# Patient Record
Sex: Female | Born: 1965 | Race: White | Hispanic: No
Health system: Midwestern US, Community
[De-identification: ages and names within clinical notes are randomized; demographics above are authoritative.]

## PROBLEM LIST (undated history)

## (undated) DIAGNOSIS — Z0000 Encounter for general adult medical examination without abnormal findings: Principal | ICD-10-CM

## (undated) DIAGNOSIS — I10 Essential (primary) hypertension: Secondary | ICD-10-CM

## (undated) DIAGNOSIS — E349 Endocrine disorder, unspecified: Secondary | ICD-10-CM

## (undated) DIAGNOSIS — Z4001 Encounter for prophylactic removal of breast: Secondary | ICD-10-CM

## (undated) DIAGNOSIS — N281 Cyst of kidney, acquired: Secondary | ICD-10-CM

## (undated) HISTORY — DX: Encounter for prophylactic removal of breast: Z40.01

## (undated) HISTORY — PX: CHOLECYSTECTOMY: SHX55

## (undated) HISTORY — PX: KNEE SURGERY: SHX244

## (undated) HISTORY — DX: Essential (primary) hypertension: I10

## (undated) HISTORY — DX: Cyst of kidney, acquired: N28.1

## (undated) HISTORY — DX: Endocrine disorder, unspecified: E34.9

---

## 1999-03-14 HISTORY — PX: TOTAL ABDOMINAL HYSTERECTOMY: SHX209

## 2015-01-28 NOTE — Procedures (Signed)
IntraOp Record - [facility]             IntraOp Record - VQQV Summary                                                                   Primary Physician:        Josiah Lobo A.-MD    Case Number:              [id number]    Finalized Date/Time:      01/28/15 13:11:06    Pt. Name:                 [name] [name], [name] [name]    D.O.B./Sex:               February 28, 1966    Female    Med Rec #:                [id number]    Physician:                Josiah Lobo A.-MD    Financial #:              [id number]    Pt. Type:                 S    Room/Bed:                 /    Admit/Disch:              01/28/15 11:41:00 -    Institution:       [facility] - Case Times                                                                                                         Entry 1                                                                                                          Patient      In Room Time             01/28/15 12:23:00               Out Room Time                   01/28/15 13:07:00    Anesthesia     Procedure  Start Time               01/28/15 12:33:00               Stop Time                       01/28/15 13:03:00    Last Modified By:         Leatha Gilding RN, DOROTHY                              01/28/15 13:09:25      [facility] - Case Times Audit                                                                          01/28/15 13:09:25         Owner: Felipa Evener                               Modifier: LIVIDO                                                        <+> 1         Out Room Time     01/28/15 13:05:31         Owner: LIVIDO                               Modifier: LIVIDO                                                        <+> 1         Stop Time     01/28/15 12:50:59         Owner: Danella Sensing                               Modifier: LIVIDO                                                        <+> 1         Start Time        [facility] - Safety Checklist - Sign In  Entry 1                                                                                                          History/Physical on       Yes                             Procedure Consent               Yes    Chart                                                     on Chart     Site Marked (if           Yes    applicable)     Last Modified By:         Leatha Gilding RN, DOROTHY                              01/28/15 12:35:46      [facility] - Case Attendance                                                                                                    Entry 1                         Entry 2                         Entry 3                                          Case Attendee             Starleen Arms T-MD           Betha Loa,  Earley Brooke A.-MD    Role Performed            Anesthesiologist                Surgical Scrub                  Surgeon Primary    Time In  01/28/15 12:23:00               01/28/15 12:23:00               01/28/15 12:23:00    Time Out                  01/28/15 13:07:00               01/28/15 13:07:00               01/28/15 13:07:00    Procedure                 Wrist Ganglion Cyst             Wrist Ganglion Cyst             Wrist Ganglion Cyst                              Excision                        Excision                        Excision    Last Modified By:         Leatha Gilding RN, Arlana Hove, RN, Arlana Hove, RN, DOROTHY                              01/28/15 13:11:03               01/28/15 13:11:03               01/28/15 13:11:03                                Entry 4                         Entry 5                                                                          Case Attendee             Leatha Gilding, RN, Barkley Boards, RN, Pricilla Riffle    Role Performed            Circulator                      Circulator Relief    Time In                   01/28/15 12:30:00               01/28/15 12:23:00     Time Out                  01/28/15 13:07:00  01/28/15 12:30:00    Procedure                 Wrist Ganglion Cyst             Wrist Ganglion Cyst                              Excision                        Excision    Last Modified By:         Carlton Adam, RN, DOROTHY                              01/28/15 13:11:03               01/28/15 13:11:03      [facility] - Case Attendance Audit                                                                     01/28/15 13:11:03         Owner: LIVIDO                               Modifier: LIVIDO                                                            1     <+> Time Out            1     <*> Procedure                              Wrist Ganglion Cyst Excision            2     <+> Time Out            2     <*> Procedure                              Wrist Ganglion Cyst Excision            3     <+> Time Out            3     <*> Procedure                              Wrist Ganglion Cyst Excision            4     <+> Time Out            4     <*> Procedure  Wrist Ganglion Cyst Excision            5     <*> Procedure                              Wrist Ganglion Cyst Excision     01/28/15 12:50:37         Owner: LIVIDO                               Modifier: LIVIDO                                                            5     <+> Time Out            5     <*> Procedure                              Wrist Ganglion Cyst Excision     01/28/15 12:36:10         Owner: LIVIDO                               Modifier: LIVIDO                                                            1     <*> Case Attendee                          Starleen Arms T-MD            1     <*> Role Performed                         Anesthesiologist            1     <*> Time In                                01/28/15 12:23:00            1     <+> Procedure            2     <*> Case Attendee                          Josiah Lobo A.-MD             2     <*> Role Performed                         Surgeon Primary            2     <*> Time In  01/28/15 12:23:00            2     <+> Procedure            3     <*> Case Attendee                          Linward Foster, RN, Pricilla Riffle            3     <*> Role Performed                         Circulator Relief            3     <*> Time In                                01/28/15 12:23:00            3     <*> Procedure                              Wrist Ganglion Cyst Excision            4     <*> Case Attendee                          NETHERTON,  BETTY A            4     <*> Role Performed                         Surgical Scrub            4     <*> Time In                                01/28/15 12:23:00            4     <*> Procedure                              Wrist Ganglion Cyst Excision            5     <*> Case Attendee                          Leatha Gilding, RN, DOROTHY            5     <*> Role Performed                         Circulator            5     <*> Time In                                01/28/15 12:30:00            5     <*> Procedure  Wrist Ganglion Cyst Excision     01/28/15 12:35:23         Owner: IRJJOA                               Modifier: LIVIDO                                                            1     <*> Case Attendee                          Starleen Arms T-MD            1     <*> Role Performed                         Anesthesiologist            1     <*> Time In                                01/28/15 12:23:00            2     <*> Case Attendee                          Josiah Lobo A.-MD            2     <*> Role Performed                         Surgeon Primary            2     <*> Time In                                01/28/15 12:23:00            3     <*> Case Attendee                          Linward Foster, RN, Pricilla Riffle            3     <*> Role Performed                         Circulator Relief            3     <*> Time In                                 01/28/15 12:23:00            3     <*> Procedure                              Wrist Ganglion Cyst Excision            4     <*>  Case Attendee                          NETHERTON,  BETTY A            4     <*> Role Performed                         Surgical Scrub            4     <*> Time In                                01/28/15 12:23:00            4     <*> Procedure                              Wrist Ganglion Cyst Excision        Entry 5 was deleted.  Higher numbered entries shifted one position to fill the gap.        <-> 5         Case Attendee                          Leatha Gilding, RN, DOROTHY        <-> 5         Role Performed                         Circulator        <-> 5         Time In        <-> 5         Procedure                              Wrist Ganglion Cyst Excision     01/28/15 12:27:37         Owner: NFAOZH                               Modifier: [email or web address]                                                        <+> 1         Time In        <+> 2         Time In        <+> 3         Case Attendee        <+> 3         Role Performed        <+> 3         Time In        <+> 3         Procedure        <+> 4         Case Attendee        <+> 4  Role Performed        <+> 4         Time In        <+> 4         Procedure        <+> 5         Case Attendee        <+> 5         Role Performed        <+> 5         Procedure        [facility] - Skin Assessment                                                                                                    Entry 1                                                                                                          Skin Integrity            Intact    Last Modified By:         Leatha Gilding, RN, DOROTHY                              01/28/15 12:42:29      [email or web address] - Patient Positioning                                                                                                Entry 1                                                                                                           Procedure  Wrist Ganglion Cyst             Body Position                   Supine                              Excision    Left Arm Position         Extended on Hand Table          Right Arm Position              Extended on Padded Arm                                                                                              Board w/Security Strap    Left Leg Position         Extended                        Right Leg Position              Extended    Feet Uncrossed            Yes                             Pressure Points                 Yes                                                              Checked     Positioning Device        Arm Cradle Foam/Gel, C          Positioned By                   Starleen Arms T-MD,                              Cradle Foam, Victorio Palm A.-MD,                              Safety Strap  Linward Foster, RN, Pricilla Riffle    Outcome Met (O.80)        Yes    Last Modified By:         Leatha Gilding, RN, DOROTHY                              01/28/15 12:42:21      [facility] - Skin Prep                                                                                                          Entry 1                                                                                                          Hair Removal     Skin Prep      Prep Agents (Im.270)     Chlorhexidine Gluconate         Prep Area (Im.270)              Hand                              2% w/Alcohol     Prep Area Details        Left                            Prep By                         Josiah Lobo A.-MD    Outcome Met (O.100)       Yes    Last Modified ByLeatha Gilding, RN, DOROTHY                              01/28/15 12:54:05      [facility] - Skin Prep Audit                                                                           01/28/15 12:54:05          Owner: Felipa Evener  Modifier: LIVIDO                                                        <+> 1         Outcome Met (O.100)        [facility] - Counts Initial and Final                                                                                           Entry 1                                                                                                          Initial Counts      Initial Counts           NETHERTON,  BETTY A,            Items included in               Sponges, Sharps     Performed By             Linward Foster, RN, Pricilla Riffle          the Initial Count     Final Counts      Final Counts             LIVINGSTON, RN,                 Final Count Status              Correct     Performed By             Lucille Passy,                              BETTY A     Items Included in        Sponges, Sharps     Final Count     Outcome Met (O.20)        Yes    Last Modified By:         Leatha Gilding, RN, DOROTHY                              01/28/15 12:38:05      [facility] - General Case Data  Entry 1                                                                                                          Case Information      ASA Class                1                               Case Level                      Level 2     OR                       JI 01                           Specialty                       Orthopedic (SN)     Wound Class              1-Clean    Preop Diagnosis           M67.432    Last Modified By:         Leatha Gilding, RN, DOROTHY                              01/28/15 12:36:18      [facility] - Fire Risk Assessment                                                                                               Entry 1                                                                                                          Fire Risk                 Alcohol Based Prep              Fire Risk  Score  1    Assessment: If            Solution    checked, checkmark     = 1 point     Last Modified By:         Leatha Gilding, RN, DOROTHY                              01/28/15 12:38:20      [facility] - Safety Checklist - Time Out                                                                                        Entry 1                                                                                                          Surgical/Procedure        Yes                             Time Out Complete               01/28/15 12:29:00    Team confirms     correct patient,     correct site and     correct procedure     Last Modified By:         Juanito Doom                              01/28/15 12:30:10      [facility] - Cautery                                                                                                            Entry 1  ESU Type                  GENERATOR                       Identification                  [id number]                              COVIDIEN/VALLEYLAB              Number     Bipolar Setting           10                              Grounding Pad                   No    (watts)                                                   Needed?     Outcome Met (O.10)        Yes    Last Modified By:         Leatha Gilding, RN, DOROTHY                              01/28/15 12:36:57      [facility] - Cautery Audit                                                                             01/28/15 12:36:57         Owner: LIVIDO                               Modifier: LIVIDO                                                            1     <*> ESU Type                               GENERATOR COVIDIEN/VALLEYLAB            1     <+> Identification Number            1     <*> Bipolar Setting (watts)                35        [facility] - Patient Care Devices  Entry 1                         Entry 2                                                                          Equipment Type            MACHINE SEQUENTIAL              BAIR HUGGER                              COMPRESSION    SCD Sleeve Site           Legs Bilateral    Equipment/Tag Number      [id number]                          [id number]    Initiated Pre             Yes                             Yes    Induction     Last Modified By:         Carlton Adam, RN, DOROTHY                              01/28/15 12:37:36               01/28/15 12:37:36      [facility] - Tourniquet                                                                                                         Entry 1                                                                                                          Tourniquet Type           TOURNIQUET PNEUMATIC            Serial Number  [id number]    Setting                   250 mmHg                        Placement                       Arm Upper Left    Padding (Im.120)          Yes    Tourniquet Times      Deflated                 01/28/15 13:02:00               Inflated                        01/28/15 12:33:00    Applied By                Josiah Lobo A.-MD           Outcome Met (O.60)              Yes    Last Modified ByLeatha Gilding, RN, DOROTHY                              01/28/15 13:05:12      [facility] - Tourniquet Audit                                                                          01/28/15 13:05:12         Owner: LIVIDO                               Modifier: LIVIDO                                                            1     <*> Tourniquet Type                        TOURNIQUET PNEUMATIC            1     <+> Deflated        [facility] - Medications  Entry 1                         Entry 2                                                                           Time Administered         01/28/15 12:30:00               01/28/15 12:30:00    Medication                BUPIVACAINE HCL 0.5%            LIDOCAINE 2% W/EPI                              INJECTION 0.5%             VIAL    Route of Admin            Local Injection                 Local Injection    Dose/Volume               2.5 ML                          2.5 ML    (include amount and     unit of measure)     Site                      Hand                            Hand    Site Detail               Left                            Left    Administered By           Josiah Lobo A.-MD           Josiah Lobo A.-MD    Outcome Met (O.130)       Yes                             Yes    Last Modified By:         Carlton Adam, RN, DOROTHY                              01/28/15 12:53:03               01/28/15 12:53:03      [facility] - Medications Audit  01/28/15 12:53:03         Owner: LIVIDO                               Modifier: LIVIDO                                                            1     <*> Medication                             BUPIVACAINE HCL 0.5% INJECTION 0.5%            1     <*> Dose/Volume (include amount and                             unit of measure)            2     <*> Medication                             LIDOCAINE 2% W/EPI VIAL            2     <*> Dose/Volume (include amount and                             unit of measure)        [facility] - Dressing/Packing                                                                                                   Entry 1                                                                                                          Site                      Hand                            Site Details                    Left    Dressing Item     Details  Dressing Item             Wound Closure Strip,            Cast/Splint (Im.290)            Cast Padding, Plaster     (Im.290)                 Medicated Gauze, 4x4's,                                         Splint                              Tape    Last Modified By:         Leatha Gilding, RN, DOROTHY                              01/28/15 12:52:04      [facility] - Dressing/Packing Audit                                                                    01/28/15 12:52:04         Owner: LIVIDO                               Modifier: LIVIDO                                                            1     <-> Miscellaneous (Im.290)                 Shoulder Immobilizer/Sling     01/28/15 12:40:45         Owner: LIVIDO                               Modifier: LIVIDO                                                            1     <+> Site Details            1     <+> Miscellaneous (Im.290)            1     <*> Cast/Splint (Im.290)                   Cast Padding        [facility] - Procedures  Entry 1                                                                                                          Procedure     Description      Procedure                Wrist Ganglion Cyst             Surgical Procedure              WRIST VOLAR CARPAL                              Excision                        Text                            GANGLION CYST EXCISION                                                                                              left    Primary Procedure         Yes                             Primary Surgeon                 Josiah Lobo A.-MD    Start                     01/28/15 12:33:00               Stop                            01/28/15 13:02:00    Anesthesia Type           Monitored Anesthesia            Surgical Service                Orthopedic (SN)                              Care    Wound Class               1-Clean     Last Modified By:         Leatha Gilding RN, DOROTHY  01/28/15 13:10:27      [facility] - Procedures Audit                                                                          01/28/15 13:10:27         Owner: LIVIDO                               Modifier: LIVIDO                                                            1     <*> Procedure                              Wrist Ganglion Cyst Excision            1     <*> Stop                                   01/28/15 13:03:00     01/28/15 13:03:34         Owner: LIVIDO                               Modifier: LIVIDO                                                            1     <*> Procedure                              Wrist Ganglion Cyst Excision            1     <+> Stop        [facility] - Safety Checklist - Sign Out                                                                                        Entry 1  Patient Safety            Yes    Communication Guide     Used Throughout Case     Last Modified By:         Leatha Gilding RN, DOROTHY                              01/28/15 56:21:30      [facility] - Transfer                                                                                                           Entry 1                                                                                                          Via                       Stretcher                       Post-op Destination             PACU    Skin Assessment     Last Modified ByLeatha Gilding, RN, DOROTHY                              01/28/15 12:42:26      [facility] - Specimens                                                                                                          Entry 1  Description               VOLAR GANGLION CYST,            Specimen Type                    Routine                              LEFT HAND    Date/Time in              01/28/15 12:43:00    Formalin (for     breast tissue only)     Last Modified By:         Leatha Gilding RN, DOROTHY                              01/28/15 12:43:32      Case Comments                                                                                         <None>              Finalized By: Leatha Gilding RN, DOROTHY      Document Signatures                                                                             Signed By:           Leatha Gilding RN, DOROTHY 01/28/15 13:11

## 2015-01-28 NOTE — Op Note (Signed)
Operative Report    [name] Lick, MD  Service Date: 01/28/2015    PREOPERATIVE DIAGNOSIS:  Volar carpal ganglion cyst, left wrist.                            POSTOPERATIVE DIAGNOSIS:  Volar carpal ganglion cyst, left wrist.                    PROCEDURE:  Excision volar carpal ganglion cyst.                    ANESTHESIA:  Local with sedation.                SURGEON:  Dr. Cyndia Diver.                    COMPLICATIONS:  None.                           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  Over time, this has become larger and is painful.  She  is brought to the operating room for excision of this lesion.  The  risks, benefits, indications and limitations for this were reviewed  preoperatively and consent obtained for surgery.                      DESCRIPTION OF PROCEDURE:  The patient was placed on the operating  table in the supine position.  A well-padded pneumatic tourniquet was  applied to the upper arm and the arm prepared with ChloraPrep solution  and draped sterilely.  The limb was exsanguinated of blood with an  Esmarch bandage and the tourniquet inflated to 250 mmHg.  A  longitudinal incision was made over the center of this cyst and  paralleling the radial border of the flexor carpi radialis.  A rather  large ganglion cyst presented in the subcutaneous space.  Two small  crossing veins were coagulated and divided.  The superficial fascia  was opened and the perimeter of this cyst was dissected free.  There  was a very small crossing branch of the terminal lateral antebrachial  cutaneous nerve.  This was adherent to part of the joint capsule, but  was carefully dissected free, reflected radially and protected.  The  cyst was a large loculated cyst which was adherent to the ulnar side  of the radial artery.  It tracked through the antebrachial fascia,  which was opened for exposure.  The cyst was dissected free from the  radial artery and deflated for  exposure.  Its stalk which tracked as  it coursed distally and ulnarly underneath the fracture flexor carpi  radialis tendon to enter the carpus at about the radioscaphocapitate  ligament.  It had a rather broad base which was dissected free.  The  base of the stalk was resected right at the point where it entered the  wrist joint.  The base of the stalk was also coagulated with bipolar  cautery.  The wound was irrigated and closed with interrupted and  running intradermal 5-0 Monocryl suture.  Sterile occlusive dressings  and a well-padded splint were applied.  There were no operative  complications.      Idelia Salm, MD  TRClaudie Leach DD: 01/29/2015 07:40 TD: 01/29/2015 08:00 Job#: [id number]  DOC#: [id number]       Signature Line    Electronically Signed on 02/02/2015 05:56 PM EST  ________________________________________________  Josiah Lobo A.-MD, MD

## 2015-01-28 NOTE — Discharge Summary (Signed)
 Inpatient Patient Summary               [facility] ??? [facility]  [address]  [address]  [place], [place] [address]  [phone]  Patient Discharge Instructions  Name: [name] [name], [name] [name]  Current Date: 01/28/2015 13:31:18  DOB: 02/19/1966 MRN: [id number] FIN: [id number]  Patient Address: [address] [place] [address]  Patient Phone: [phone]  Primary Care Provider:  Name: Pcp,  Unknown  Phone:    Immunizations Provided:    Discharge Diagnosis:   Discharged To: ANTICIPATED%>  Home Treatments: ANTICIPATED%>  Devices/Equipment: REHAB%>  Post Hospital Services: HOSPITAL SERVICES%>  Professional Skilled Services: SKILLED SERVICES%>  Therapist, sports and Community Resources: SERV AND COMM RES, ANTICIPATED%>  Mode of Discharge Transportation: TRANSPORTATION%>  Discharge Orders         Discharge Patient 01/28/15 13:12:00 EST, Discharge Home/Self Care      Comment:   Medications   During the course of your visit, your medication list was updated with the most current information. The details of those changes are reflected below:         Medications that have not changed  Other Medications  fluticasone nasal (Flonase 50 mcg/inh nasal spray) 1 Sprays Nasal (into the nose) 2 times a day.  Last Dose:____________________  loratadine (Claritin 10 mg oral tablet) 1 Tabs Oral (given by mouth) every day as needed.  Last Dose:____________________  ondansetron (Zofran 4 mg oral tablet) 1 Tabs Oral (given by mouth) every 8 hours as needed.  Last Dose:____________________      [facility] would like to thank you for allowing [place] to assist you with your healthcare needs. The following includes patient education materials and information regarding your injury/illness.  [name], [name] has been given the following list of follow-up instructions, prescriptions, and patient education materials:  Follow-up Instructions             With: Address: When:   Unknown Pcp          With: Address: When:   Josiah Lobo A.-MD     [phone]    Comments:   Appointment Scheduled                    Type Location Start [facility]   zMammogram Tomosynthesis Screening Mammo [facility] Mammo 02/18/2015 14:45:00 02/18/2015 14:55:00 Confirmed            It is important to always keep an active list of medications available so that you can share with other providers and manage your medications appropriately. As an additional courtesy, we are also providing you with your final active medications list that you can keep with you.          fluticasone nasal (Flonase 50 mcg/inh nasal spray) 1 Sprays Nasal (into the nose) 2 times a day.  loratadine (Claritin 10 mg oral tablet) 1 Tabs Oral (given by mouth) every day as needed.  ondansetron (Zofran 4 mg oral tablet) 1 Tabs Oral (given by mouth) every 8 hours as needed.      Take only the medications listed above. Contact your doctor prior to taking any medications not on this list.  Discharge instructions, if any, will display below  Instructions for Diet: INSTRUCTIONS FOR DIET%>   Instructions for Supplements: SUPPLEMENT INSTRUCTIONS%>   Instructions for Activity: INSTRUCTIONS FOR ACTIVITY%>   Instructions for Wound Care: INSTRUCTIONS FOR WOUND CARE%>  Medication leaflets, if any, will  display below     Patient education materials, if any, will display below           UPPER EXTREMITY (Hand/Arm) - Out Patient Post Operative Instructions  General Information:  - You may experience lightheadedness, forgetfulness, dizziness, sleepiness, headache, nausea, sore throat, or muscular pains following surgery  - For any emergencies call 911  -Your reflexes will be dimished after receiving anesthetic drugs         Do not operate a vechicle or heavy machinery for 24 hours         Do not drink any alcoholic beverages or smoke for 24 hours         Avoid making any important decisions for 24 hours         Do not stay alone for the next 24 hours  Diet/Fluids  -Begin with  clear liquids, then progress to your regular diet if no nausea   -Greasy and spicy foods are not advised  Activity  -You are advised to go directly home from the hospital and restrict your activites for the rest of the day  Medications:  -Follow your Discharge Medication sheet, as instructed  -If ordered, begin any newly prescribed medications. Discontinue use if: nausea, vomiting, itching or rash develops and call your doctor   -If you develop a fever (over 101*), chills, active bleeding, excessive swelling, or nausea or vomiting past the 24hr period call your doctor.   Specific Instructions:  -Elevate extremity 48-72 hours  -Keep dressing clean, dry and intact until follow up.   -Check for circulation changes to operative area (pale/blue color)  -Open and close hand to exercise if instructed  -If you have had a block, wear sling until arm is fully awake.   Follow up Care:  Call the office if you don't already have an appt scheduled.             IS IT A STROKE? Act FAST and Check for these signs:   FACE Does the face look uneven?   ARM Does one arm drift down?   SPEECH Does their speech sound strange?   TIME Call 9-1-1 at any sign of stroke  Heart Attack Signs  Chest discomfort: Most heart attacks involve discomfort in the center of the chest and lasts more than a few minutes, or goes away and comes back. It can feel like uncomfortable pressure, squeezing, fullness or pain.  Discomfort in upper body: Symptoms can include pain or discomfort in one or both arms, back, neck, jaw or stomach.  Shortness of breath: With or without discomfort.  Other signs: Breaking out in a cold sweat, nausea, or lightheaded.  Remember, MINUTES DO MATTER. If you experience any of these heart attack warning signs, call 9-1-1 to get immediate medical attention!       Yes - Patient/Family/Caregiver demonstrates understanding of instructions given  ______________________________ ___________ ___________________ ___________  Patient/Family/  Caregiver Signature Date/Time Provider Signature Date/Time

## 2015-01-28 NOTE — Discharge Summary (Signed)
 Inpatient Clinical Summary             [facility]  Post-Acute Care Transfer Instructions  PERSON INFORMATION   Name: [name] [name], [name] [name]   MRN: [id number]    FIN#: [phone]   PHYSICIANS  Admitting Physician: Josiah Lobo A.-MD  Attending Physician: Josiah Lobo A.-MD   PCP: Pcp,  Unknown  Discharge Diagnosis:   Comment:       PATIENT EDUCATION INFORMATION  Instructions:             OUT PATIENT POST OP JI HAND/ARM (CUSTOM)  Medication Leaflets:               Follow-up:                        With: Address: When:   Unknown Pcp         With: Address: When:   Josiah Lobo A.-MD     [phone]    Comments:   Appointment Scheduled                               Type Location Start [facility]   zMammogram Tomosynthesis Screening Mammo [facility] Mammo 02/18/2015 14:45:00 02/18/2015 14:55:00 Confirmed          MEDICATION LIST  Medication Reconciliation at Discharge:         Medications that have not changed  Other Medications  fluticasone nasal (Flonase 50 mcg/inh nasal spray) 1 Sprays Nasal (into the nose) 2 times a day.  Last Dose:____________________  loratadine (Claritin 10 mg oral tablet) 1 Tabs Oral (given by mouth) every day as needed.  Last Dose:____________________  ondansetron (Zofran 4 mg oral tablet) 1 Tabs Oral (given by mouth) every 8 hours as needed.  Last Dose:____________________         Patient???s Final Home Medication List Upon Discharge:          fluticasone nasal (Flonase 50 mcg/inh nasal spray) 1 Sprays Nasal (into the nose) 2 times a day.  loratadine (Claritin 10 mg oral tablet) 1 Tabs Oral (given by mouth) every day as needed.  ondansetron (Zofran 4 mg oral tablet) 1 Tabs Oral (given by mouth) every 8 hours as needed.         Comment:       ORDERS         Order Name Order Details   Discharge Patient 01/28/15 13:12:00 EST, Discharge Home/Self Care

## 2015-11-01 NOTE — Op Note (Signed)
Procedure Report    [facility] D. Iran Sizer, MD  Service Date: 11/01/2015    INDICATIONS:    1.  Left lower quadrant abdominal pain.  2.  Family history of colon cancer.    CONSENT:  After the risks and benefits were explained to the patient,  informed consent was obtained.    ANESTHESIA:  General with propofol.    DESCRIPTION OF PROCEDURE:  After induction of anesthesia, a  colonoscope was introduced in the anal canal, extended to the level of  the terminal ileum.  The patient toleration was excellent.  Views were  excellent.    FINDINGS:  The patient was noted to have 1 diminutive polyp within the  transverse colon.  This was removed with cold biopsy forceps.  In the  sigmoid colon, there was a diminutive polyp, this was removed with  cold biopsy forceps.  On retroflexion, she had small to medium size  internal hemorrhoids.  Otherwise, the colon appeared normal.  The  terminal ileum was normal.    IMPRESSION:  1.  Transverse colon polyps status post polypectomy.  2.  Sigmoid colon polyps status post polypectomy.  3.  Internal hemorrhoids.    RECOMMENDATIONS:  1.  Resume usual diet and medications.  2.  Watch for complications including bleeding, infection,  perforation.  3.  The patient will need repeat colonoscopy in 5 years.      Thereasa Solo, MD  TR: *n DD: 11/01/2015 09:01 TD: 11/01/2015 11:14 Job#: [id number]  \\[id number]\\DOC#: [id number]  \\[id number]\\  Signature Line    Electronically Signed on 11/01/2015 01:46 PM EDT  ________________________________________________  Ronnell Freshwater D

## 2015-11-01 NOTE — Procedures (Signed)
Procedure Record - [facility]END             Procedure Record - [facility]END Summary                                                                Primary Physician:        Ronnell Freshwater D    Case Number:              [id number]    Finalized Date/Time:      11/01/15 08:59:24    Pt. Name:                 [name] [name], [name] [name]    D.O.B./Sex:               1965/10/19    Female    Med Rec #:                [id number]    Physician:                Ronnell Freshwater D    Financial #:              [id number]    Pt. Type:                 O    Room/Bed:                 /    Admit/Disch:              11/01/15 06:38:00 -    Institution:       [facility] - Case Times                                                                                                        Entry 1                                                                                                          Patient      In Room Time             11/01/15 08:39:00               Out Room Time                   11/01/15 08:59:00    Anesthesia     Procedure      Start  Time               11/01/15 08:43:00               Stop Time                       11/01/15 08:58:00    Last Modified By:         Venia Carbon A-RN                              11/01/15 08:59:14      RHEND - Case Times Audit                                                                         11/01/15 08:59:14         Owner: PRICMI                               Modifier: PRICMI                                                        <+> 1         Out Room Time     11/01/15 08:58:13         Owner: PRICMI                               Modifier: PRICMI                                                        <+> 1         Stop Time     11/01/15 08:43:33         Owner: PRICMI                               Modifier: PRICMI                                                        <+> 1         Start Time        RHEND - Case Attendance  Entry 1                         Entry 2                         Entry 3                                          Case Attendee             NOBLE-MD,  MARC D               OCKERMAN-MD,  TROY Henry Russel,  Menno    Role Performed            Surgeon Primary                 Anesthesiologist                Endoscopy Technician    Time In                   11/01/15 08:39:00               11/01/15 08:39:00               11/01/15 08:39:00    Time Out                  11/01/15 08:59:00               11/01/15 08:59:00               11/01/15 08:59:00    Procedure                 Colonoscopy with Polyp          Colonoscopy with Polyp          Colonoscopy with Polyp                              Cold                            Cold                            Cold    Last Modified By:         Venia Carbon A-RN           PRICE,  MICHELLE A-RN           PRICE,  MICHELLE A-RN                              11/01/15 08:59:15               11/01/15 08:59:15               11/01/15 08:59:15                                Entry 4  Case Attendee             PRICE,  MICHELLE A-RN    Role Performed            Circulator    Time In                   11/01/15 08:39:00    Time Out                  11/01/15 08:59:00    Procedure                 Colonoscopy with Polyp                              Cold    Last Modified By:         Venia Carbon A-RN                              11/01/15 08:59:15      RHEND - Case Attendance Audit                                                                    11/01/15 08:59:15         Owner: PRICMI                               Modifier: PRICMI                                                            1     <+> Time Out            1     <*> Procedure                              Colonoscopy with Polyp Cold            2     <+> Time Out            2     <*> Procedure                               Colonoscopy with Polyp Cold            3     <+> Time Out            3     <*> Procedure                              Colonoscopy with Polyp Cold            4     <+> Time Out            4     <*>  Procedure                              Colonoscopy with Polyp Cold     11/01/15 08:52:58         Owner: PRICMI                               Modifier: PRICMI                                                        <+> 1         Procedure        <+> 2         Procedure        <+> 3         Procedure        <+> 4         Procedure     11/01/15 08:52:52         Owner: PRICMI                               Modifier: PRICMI                                                            1     <-> Procedure                              Colonoscopy            2     <-> Procedure                              Colonoscopy            3     <-> Procedure                              Colonoscopy            4     <-> Procedure                              Colonoscopy     11/01/15 08:43:34         Owner: PRICMI                               Modifier: PRICMI                                                            1     <+>  Time In            1     <*> Procedure                              Colonoscopy            2     <+> Time In            2     <*> Procedure                              Colonoscopy            3     <+> Time In            3     <*> Procedure                              Colonoscopy            4     <+> Time In            4     <*> Procedure                              Colonoscopy     11/01/15 08:37:39         Owner: PRICMI                               Modifier: PRICMI                                                        <+> 2         Case Attendee        <+> 2         Role Performed        <+> 2         Procedure        <+> 3         Case Attendee        <+> 3         Role Performed        <+> 3         Procedure        <+> 4         Case Attendee        <+> 4         Role Performed        <+> 4          Procedure        RHEND - Patient Positioning                                                                     Pre-Care Text:  A.240 Assesses baseline skin condition A.280 Identifies baseline musculoskeletal status A.280.1 Identifies           physical alterations that require additional precautions for procedure-specific positioning A.510.8 Maintains           patient's dignity and privacy Im.120 Implements protective measures to prevent skin/tissue injury due to           mechanical sources Im.40 Positions the patient Im.80 Applies safety devices                              Entry 1                                                                                                          Procedure                 Colonoscopy with Polyp          Body Position                   Lateral Right Up                              Cold    Left Arm Position         Resting at Side                 Right Arm Position              Resting at Side    Left Leg Position         Extended                        Right Leg Position              Extended    Feet Uncrossed            Yes                             Pressure Points                 Yes                                                              Checked     Positioned By             Campbell Stall,               Outcome Met (O.80)              Yes  PRICE,  MICHELLE A-RN    Last Modified By:         Venia Carbon A-RN                              11/01/15 08:52:59    Post-Care Text:            E.10 Evaluates for signs and symptoms of physical injury to skin and tissue E.290 Evaluates musculoskeletal           status O.80 Patient is free from signs and symptoms of injury related to positioning O.120 the patient is free           from signs and symptoms of injury related to transfer/transport  O.250 Patient's musculoskeletal status is           maintained at or improved from baseline levels      [facility]END - Patient Positioning Audit                                                                 11/01/15 08:52:59         Owner: PRICMI                               Modifier: PRICMI                                                        <+> 1         Procedure     11/01/15 08:52:53         Owner: PRICMI                               Modifier: PRICMI                                                            1     <-> Procedure                              Colonoscopy     11/01/15 08:44:08         Owner: PRICMI                               Modifier: PRICMI                                                            1     <+> Positioned By  1     <-> Positioning Device                     Foam Wedge            1     <*> Procedure                              Colonoscopy            1     <+> Outcome Met (O.80)        [facility]END - Skin Assessment                                                                         Pre-Care Text:            A.240 Assesses baseline skin condition Im.120 Implements protective measures to prevent skin or tissue injury           due to mechanical sources  Im.280.1 Implements progective measures to prevent skin or tissue injury due to           thermal sources Im.360 Monitors for signs and symptons of infection                              Entry 1                                                                                                          Skin Integrity            Intact    Last Modified By:         Venia Carbon A-RN                              11/01/15 08:55:15    Post-Care Text:            E.10 Evaluates for signs and symptoms of physical injury to skin and tissue E.270 Evaluate tissue perfusion           O.60 Patient is free from signs and symptoms of injury caused by extraneous objects   O.210 Patinet's tissue           perfusion is consistent with or improved from baseline levels      [facility]END - Procedure Time Out  Pre-Care Text:             A.10 Confirms patient identity A.20 Verifies operative procedure, surgical site, and laterality A.20.1 Verifies           consent for planned procedure A.30 Verifies allergies                              Entry 1                                                                                                          Surgical/Procedure        Yes                             Time Out Complete               11/01/15 08:42:00    Team confirms     correct patient,     correct site and     correct procedure     Last Modified By:         Venia Carbon A-RN                              11/01/15 08:42:17    Post-Care Text:            E.30 Evaluates verification process for correct patient, site, side, and level surgery      RHEND - General Case Data                                                                                                 Entry 1                                                                                                          Case Information      ASA Class                2  Case Level                      Level 1     OR                       RH Endo 02                      Specialty                       Gastroenterology (SN)     Wound Class              No Incision    Preop Diagnosis           RECTAL BLEEDING, FAMILY                              COLON CA, LOWER LEFT                              QUAD PAIN    Last Modified By:         Venia Carbon A-RN                              11/01/15 08:44:14      RHEND - General Case Data Audit                                                                  11/01/15 08:44:14         Owner: PRICMI                               Modifier: PRICMI                                                            1     <*> ASA Class                              2        RHEND - Procedures                                                                                                        Entry 1  Procedure     Description      Procedure                Colonoscopy with Polyp          Surgical Procedure              COLON / ANES                              Cold                            Text     Primary Procedure         Yes                             Primary Surgeon                 Ronnell Freshwater D    Start                     11/01/15 08:43:00               Stop                            11/01/15 08:58:00    Anesthesia Type           Monitored Anesthesia            Surgical Service                Gastroenterology (SN)                              Care    Wound Class               No Incision    Last Modified By:         Venia Carbon A-RN                              11/01/15 08:59:19      RHEND - Procedures Audit                                                                         11/01/15 08:59:19         Owner: PRICMI                               Modifier: PRICMI                                                        <+> 1         Stop     11/01/15 08:52:55  Owner: PRICMI                               Modifier: PRICMI                                                            1     <*> Procedure                              Colonoscopy with Polyp Cold     11/01/15 08:44:16         Owner: PRICMI                               Modifier: PRICMI                                                        <+> 1         Start        RHEND - Carbon Dioxide Insufflation                                                                                       Entry 1                                                                                                          Procedure                 Colonoscopy with Polyp          Carbon Dioxide                  REGULATOR CO2 ON WALL                              Cold                            Insufflator     Insufflation Mode         Managed Flow                    Flow Rate  5 L/min    Last Modified By:         Venia Carbon A-RN                              11/01/15 08:52:59      RHEND - Carbon Dioxide Insufflation Audit                                                        11/01/15 08:52:59         Owner: PRICMI                               Modifier: PRICMI                                                        <+> 1         Procedure     11/01/15 08:52:52         Owner: PRICMI                               Modifier: PRICMI                                                            1     <-> Procedure                              Colonoscopy        [facility]END - Specimens                                                                               Pre-Care Text:            A.20 Verifies operative procedure, surgical site, and laterality Im.320 Manages culture specimen collection           Im.330 Manages specimen handling and disposition                              Entry 1                         Entry 2  Description               A) Transverse Colon             B) Sigmoid Colon Polyp                              Polyp    Specimen Type             Routine                         Routine    Date/Time in     Formalin (for     breast tissue only)     Last Modified By:         Nehemiah Massed,  MICHELLE A-RN                              11/01/15 08:54:55               11/01/15 08:57:51    Post-Care Text:            E.40 Evaluates correct processes have been performed for specimen handling and disposition O.40 Patient's           specimen(s) is managed in the appropriate manner      RHEND - Specimens Audit                                                                          11/01/15 08:57:51         Owner: PRICMI                               Modifier: PRICMI                                                        <+> 2         Description        <+> 2         Specimen Type         RHEND - Lower Endoscopy Detail                                                                                            Entry 1  Colonoscopy     Completion Details      Cecum Reached            Yes                             Cecum Reached                   11/01/15 08:47:00     Withdrawal Time          11/01/15 08:58:00    Hemorrhoid     Treatment Details     Last Modified By:         Venia Carbon A-RN                              11/01/15 08:58:10      RHEND - Lower Endoscopy Detail Audit                                                             11/01/15 08:58:10         Owner: PRICMI                               Modifier: PRICMI                                                            1     <*> Withdrawal Time                        11/01/15 08:58:00     11/01/15 08:57:31         Owner: PRICMI                               Modifier: PRICMI                                                        <+> 1         Withdrawal Time     11/01/15 08:48:55         Owner: PRICMI                               Modifier: PRICMI                                                        <+> 1         Cecum Reached        RHEND - Transfer  Entry 1                                                                                                          Transferred By            Venia Carbon A-RN,          Via                             Stretcher                              Alpha Gula,  TROY Fayrene Fearing    Post-op Destination       PACU    Skin Assessment      Condition                Intact    Last Modified By:         Venia Carbon A-RN                              11/01/15 08:44:41      Case Comments                                                                                         <None>              Finalized  By: Venia Carbon A-RN      Document Signatures                                                                             Signed By:           Venia Carbon A-RN 11/01/15 08:59

## 2015-11-01 NOTE — Discharge Summary (Signed)
 Inpatient Clinical Summary             [facility]  Post-Acute Care Transfer Instructions  PERSON INFORMATION   Name: MAECIE, KILZER   MRN: [id number]    FIN#: [id number]   PHYSICIANS  Admitting Physician: Ronnell Freshwater D  Attending Physician: Ronnell Freshwater D   PCP: Gillian Scarce  Discharge Diagnosis: FH: GI cancer; LLQ pain; Rectal bleed  Comment:       PATIENT EDUCATION INFORMATION  Instructions:               Medication Leaflets:               Follow-up:                                         Type Location Start [place]   [place] Tomo Screening Mammogram [facility] Mammo 02/24/2016 14:50:00 02/24/2016 15:00:00 Confirmed          MEDICATION LIST  Medication Reconciliation at Discharge:         Medications That Have Not Changed  Other Medications  fluticasone nasal (Flonase 50 mcg/inh nasal spray) 1 Sprays Nasal (into the nose) every day.  Last Dose:____________________  hydrochlorothiazide Oral (given by mouth) every day.  Last Dose:____________________  loratadine (Claritin 10 mg oral tablet) 1 Tabs Oral (given by mouth) every day as needed.  Last Dose:____________________  ondansetron (Zofran 4 mg oral tablet) 1 Tabs Oral (given by mouth) every 8 hours as needed.  Last Dose:____________________  potassium chloride (Klor-Con M10 oral tablet, extended release) 1 Tabs Oral (given by mouth) every day.  Last Dose:____________________         Patient???s Final Home Medication List Upon Discharge:          fluticasone nasal (Flonase 50 mcg/inh nasal spray) 1 Sprays Nasal (into the nose) every day.  hydrochlorothiazide Oral (given by mouth) every day.  loratadine (Claritin 10 mg oral tablet) 1 Tabs Oral (given by mouth) every day as needed.  ondansetron (Zofran 4 mg oral tablet) 1 Tabs Oral (given by mouth) every 8 hours as needed.  potassium chloride (Klor-Con M10 oral tablet, extended release) 1 Tabs Oral (given by mouth) every day.         Comment:       ORDERS         Order Name Order Details   Discharge  Patient 11/01/15 8:59:00 EDT

## 2015-11-01 NOTE — Discharge Summary (Signed)
Inpatient Patient Summary               [facility]  [address]  [place], [place] [address]  [phone]  Patient Discharge Instructions     Name: ANAYS, DOWLER  Current Date: 11/01/2015 09:15:47  DOB: [birth year] MRN: [id number] FIN: NBR%[phone]  Patient Address: [address] [place] [facility] [address]  Patient Phone: [phone]  Primary Care Provider:  Name: Gillian Scarce  Phone: [phone]   Immunizations Provided:       Discharge Diagnosis: FH: GI cancer; LLQ pain; Rectal bleed  Discharged To: TO, ANTICIPATED%>  Home Treatments: TREATMENTS, ANTICIPATED%>  Devices/Equipment: EQUIPMENT REHAB%>  Post Hospital Services: HOSPITAL SERVICES%>  Professional Skilled Services: SKILLED SERVICES%>  Therapist, sports and Community Resources:                SERV AND COMM RES, ANTICIPATED%>  Mode of Discharge Transportation: TRANSPORTATION%>  Discharge Orders         Discharge Patient 11/01/15 8:59:00 EDT         Comment:      Medications   During the course of your visit, your medication list was updated with the most current information. The details of those changes are reflected below:         Medications That Have Not Changed  Other Medications  fluticasone nasal (Flonase 50 mcg/inh nasal spray) 1 Sprays Nasal (into the nose) every day.  Last Dose:____________________  hydrochlorothiazide Oral (given by mouth) every day.  Last Dose:____________________  loratadine (Claritin 10 mg oral tablet) 1 Tabs Oral (given by mouth) every day as needed.  Last Dose:____________________  ondansetron (Zofran 4 mg oral tablet) 1 Tabs Oral (given by mouth) every 8 hours as needed.  Last Dose:____________________  potassium chloride (Klor-Con M10 oral tablet, extended release) 1 Tabs Oral (given by mouth) every day.  Last Dose:____________________         [facility] would like to thank you for allowing [place] to assist you with your healthcare needs. The following includes patient education materials and information  regarding your injury/illness.     Journey, [place] has been given the following list of follow-up instructions, prescriptions, and patient education materials:  Follow-up Instructions               Type Location Start [place]   [place] Tomo Screening Mammogram [facility] Mammo 02/24/2016 14:50:00 02/24/2016 15:00:00 Confirmed                   It is important to always keep an active list of medications available so that you can share with other providers and manage your medications appropriately. As an additional courtesy, we are also providing you with your final active medications list that you can keep with you.           fluticasone nasal (Flonase 50 mcg/inh nasal spray) 1 Sprays Nasal (into the nose) every day.  hydrochlorothiazide Oral (given by mouth) every day.  loratadine (Claritin 10 mg oral tablet) 1 Tabs Oral (given by mouth) every day as needed.  ondansetron (Zofran 4 mg oral tablet) 1 Tabs Oral (given by mouth) every 8 hours as needed.  potassium chloride (Klor-Con M10 oral tablet, extended release) 1 Tabs Oral (given by mouth) every day.      Take only the medications listed above. Contact your doctor prior to taking any medications not on this list.        Discharge instructions, if any, will display below  Instructions for Diet: INSTRUCTIONS FOR DIET%>   Instructions for Supplements: SUPPLEMENT INSTRUCTIONS%>   Instructions for Activity: INSTRUCTIONS FOR ACTIVITY%>   Instructions for Wound Care: INSTRUCTIONS FOR WOUND CARE%>     Medication leaflets, if any, will display below         Patient education materials, if any, will display below              IS IT A STROKE?  Act FAST and Check for these signs:     FACE                  Does the face look uneven?     ARM                    Does one arm drift down?     SPEECH             Does their speech sound strange?     TIME                   Call 9-1-1 at any sign of stroke  Heart Attack Signs  Chest discomfort: Most heart attacks involve discomfort in the  center of the chest and lasts more than a few minutes, or goes away and comes back. It can feel like uncomfortable pressure, squeezing, fullness or pain.  Discomfort in upper body: Symptoms can include pain or discomfort in one or both arms, back, neck, jaw or stomach.  Shortness of breath: With or without discomfort.  Other signs: Breaking out in a cold sweat, nausea, or lightheaded.  Remember, MINUTES DO MATTER. If you experience any of these heart attack warning signs, call 9-1-1 to get immediate medical attention!             Yes - Patient/Family/Caregiver demonstrates understanding of instructions given  ______________________________ ___________ ___________________ ___________  Patient/Family/ Caregiver Signature Date/Time          Provider Signature Date/Time

## 2016-02-15 NOTE — Nursing Note (Signed)
Medication Administration Follow Up-Text       Medication Administration Follow Up Entered On:  02/15/2016 22:21 EST    Performed On:  02/15/2016 21:27 EST by Rennis Petty, RN, STACEY A      Intervention Information:     acetaminophen  Performed by Rennis Petty, RN, STACEY A on 02/15/2016 20:27:00 EST       acetaminophen,650mg   Oral,mild pain (1-3)       Medication Effectiveness Evaluation   Medication Administration Reason :   Pain   Medication Effective :   Yes   Medication Response :   Symptoms improved, Continue to observe for symptoms   HERRON, RN, STACEY A - 02/15/2016 22:21 EST   Pain Assessment   Numeric Rating Pain Scale :   2   Laterality :   Bilateral   Pain Location :   Breast   HERRON, RN, STACEY A - 02/15/2016 22:21 EST   Image 4 -  Images currently included in the form version of this document have not been included in the text rendition version of the form.   POSS Assessment   Pasero Opioid Induced Sedation Scale :   1 = Awake and alert   Pasero Opioid Induced Sedation Score :   1    HERRON, RN, STACEY A - 02/15/2016 22:21 EST

## 2016-02-15 NOTE — Nursing Note (Signed)
Medication Administration Follow Up-Text       Medication Administration Follow Up Entered On:  02/15/2016 17:40 EST    Performed On:  02/15/2016 17:35 EST by Lorenda Ishihara, RN, Christine      Intervention Information:     ondansetron  Performed by Lorenda Ishihara, RN, Christine on 02/15/2016 17:20:00 EST       ondansetron,4mg   IV Push,Hand, Right,nausea       Medication Effectiveness Evaluation   Medication Administration Reason :   Nausea   Medication Response :   Continue to observe for symptoms   Lorenda Ishihara, RN, Christine - 02/15/2016 17:40 EST

## 2016-02-15 NOTE — Nursing Note (Signed)
Basic Admission Information - Text       Basic Admission Information Adult Entered On:  02/15/2016 18:29 EST    Performed On:  02/15/2016 18:27 EST by Maisie Fus, RN, BRITNEY O               Height/Weight   Height/Length Measured :   162.56 cm(Converted to: 5 ft 4 in, 5.33 ft, 64.00 in)    Height/Length Estimated :   162.5 cm(Converted to: 5 ft 4 in, 5.33 ft, 63.98 in)    Body Mass Index Estimated :   0 kg/m2   Ideal Body Weight Calculated :   54.7 kg   Glenford Peers - 02/15/2016 18:27 EST   Allergies   (As Of: 02/15/2016 18:29:07 EST)   Allergies (Active)   No Known Allergies  Estimated Onset Date:   Unspecified ; Created By:   Claria Dice; Reaction Status:   Active ; Category:   Drug ; Substance:   No Known Allergies ; Type:   Allergy ; Updated By:   Claria Dice; Reviewed Date:   02/15/2016 11:59 EST        Medication History   Medication List   (As Of: 02/15/2016 18:29:07 EST)   Normal Order    Lactated Ringers Injection solution 1,000 mL  :   Lactated Ringers Injection solution 1,000 mL ; Status:   Ordered ; Ordered As Mnemonic:   Lactated Ringers Injection 1,000 mL ; Simple Display Line:   10 mL/hr, IV ; Ordering Provider:   Audry Riles; Catalog Code:   Lactated Ringers Injection ; Order Dt/Tm:   02/15/2016 11:51:02 ; Comment:   Perioperative use ONLY  For Non Dialysis Patient          hydrochlorothiazide 25 mg Tab  :   hydrochlorothiazide 25 mg Tab ; Status:   Ordered ; Ordered As Mnemonic:   hydrochlorothiazide ; Simple Display Line:   25 mg, 1 tabs, Oral, qAM ; Ordering Provider:   Neena Rhymes; Catalog Code:   hydrochlorothiazide ; Order Dt/Tm:   02/15/2016 16:15:14          acetaminophen 325 mg Tab  :   acetaminophen 325 mg Tab ; Status:   Discontinued ; Ordered As Mnemonic:   acetaminophen ; Simple Display Line:   650 mg, 2 tabs, Oral, Once, PRN: mild pain (1-3) ; Ordering Provider:   Wyvonne Lenz; Catalog Code:   acetaminophen ; Order Dt/Tm:   02/15/2016 16:56:54  ; Comment:   MAX DAILY DOSE OF ACETAMINOPHEN = 3000 MG          acetaminophen-oxyCODONE 325 mg-5 mg Tab  :   acetaminophen-oxyCODONE 325 mg-5 mg Tab ; Status:   Discontinued ; Ordered As Mnemonic:   oxyCODONE-acetaminophen 5 mg-325 mg oral tablet range dose ; Simple Display Line:   2 tabs, Oral, Once, PRN: severe pain (8-10) ; Ordering Provider:   Wyvonne Lenz; Catalog Code:   acetaminophen-oxyCODONE ; Order Dt/Tm:   02/15/2016 16:56:54 ; Comment:   May repeat X 1 in 30 min  MAX DAILY DOSE OF ACETAMINOPHEN = 3000 MG          acetaminophen-HYDROcodone 325 mg-5 mg Tab  :   acetaminophen-HYDROcodone 325 mg-5 mg Tab ; Status:   Discontinued ; Ordered As Mnemonic:   HYDROcodone-acetaminophen 5 mg-325 mg range dose ; Simple Display Line:   2 tabs, Oral, Once, PRN: moderate pain (4-7) ; Ordering Provider:   PHELPS-MD,  SARAH; Catalog Code:   HYDROcodone-acetaminophen ; Order Dt/Tm:   02/15/2016 16:56:54          HYDROmorphone 2 mg/mL Inj Soln 1 mL  :   HYDROmorphone 2 mg/mL Inj Soln 1 mL ; Status:   Discontinued ; Ordered As Mnemonic:   HYDROmorphone range dose ; Simple Display Line:   0.5 mg, 0.25 mL, IV Push, q1min, PRN: other (see comment) ; Ordering Provider:   Wyvonne Lenz; Catalog Code:   HYDROmorphone ; Order Dt/Tm:   02/15/2016 16:56:36 ; Comment:   For Pain  Max dose = 2mg  Primary choice  PACU ONLY          ondansetron 2 mg/mL Inj Soln 2 mL  :   ondansetron 2 mg/mL Inj Soln 2 mL ; Status:   Discontinued ; Ordered As Mnemonic:   ondansetron ; Simple Display Line:   4 mg, 2 mL, IV Push, Once, PRN: nausea/vomiting ; Ordering Provider:   Wyvonne Lenz; Catalog Code:   ondansetron ; Order Dt/Tm:   02/15/2016 16:56:55          ondansetron 2 mg/mL Inj Soln 2 mL  :   ondansetron 2 mg/mL Inj Soln 2 mL ; Status:   Completed ; Ordered As Mnemonic:   ondansetron ; Simple Display Line:   4 mg, 2 mL, IV Push, Once, PRN: nausea ; Ordering Provider:   Wyvonne Lenz; Catalog Code:   ondansetron ; Order Dt/Tm:    02/15/2016 16:56:36 ; Comment:   Primary choice          promethazine 25 mg/mL Inj Soln 1 mL  :   promethazine 25 mg/mL Inj Soln 1 mL ; Status:   Discontinued ; Ordered As Mnemonic:   promethazine IV range dose - High Alert ; Simple Display Line:   12.5 mg, 0.5 mL, IV, Once, PRN: nausea ; Ordering Provider:   Wyvonne Lenz; Catalog Code:   promethazine ; Order Dt/Tm:   02/15/2016 16:56:36 ; Comment:   Please dilute promethazine 25mg / 1 mL with 9 mL of normal saline for a total of 10 mL  (final concentration equals 2.5 mg / 1 mL) and push dose over 1 minute.          scopolamine 1.5 mg TD Film, ER  :   scopolamine 1.5 mg TD Film, ER ; Status:   Completed ; Ordered As Mnemonic:   Transderm-Scop 1.5 mg transdermal film, extended release ; Simple Display Line:   1.5 mg, 1 patches, Topical, Once ; Ordering Provider:   Wyvonne Lenz; Catalog Code:   scopolamine ; Order Dt/Tm:   02/15/2016 12:09:21          A Patient Specific Medication  :   A Patient Specific Medication ; Status:   Ordered ; Ordered As Mnemonic:   A Patient Specific Medication ; Simple Display Line:   1 EA, Kit-Combo, q46min, PRN: other (see comment) ; Ordering Provider:   Neena Rhymes; Catalog Code:   A Patient Specific Medication ; Order Dt/Tm:   02/15/2016 12:07:31 ; Comment:   to access the patient specific medication drawer          A Patient Specific Refrigerated Medication  :   A Patient Specific Refrigerated Medication ; Status:   Ordered ; Ordered As Mnemonic:   A Patient Specific Refrigerated Medication ; Simple Display Line:   1 EA, Kit-Combo, q26min, PRN: other (see comment) ; Ordering Provider:   Neena Rhymes;  Catalog Code:   A Patient Specific Refrigerated Medicati ; Order Dt/Tm:   02/15/2016 12:07:32 ; Comment:   to access the patient specific Refrigerated medications          lidocaine 1% PF Inj Soln 2 mL  :   lidocaine 1% PF Inj Soln 2 mL ; Status:   Ordered ; Ordered As Mnemonic:   lidocaine 1%  preservative-free injectable solution ; Simple Display Line:   0.5 mL, ID, q37min, PRN: other (see comment) ; Ordering Provider:   Neena Rhymes; Catalog Code:   lidocaine ; Order Dt/Tm:   02/15/2016 12:07:32 ; Comment:   to access lidocaine 1%  2 mL vial for IV start and Life Port access          Respiratory MDI Treatment  :   Respiratory MDI Treatment ; Status:   Ordered ; Ordered As Mnemonic:   Respiratory MDI Treatment ; Simple Display Line:   1 EA, Kit-Combo, q68min, PRN: other (see comment) ; Ordering Provider:   Neena Rhymes; Catalog Code:   Respiratory MDI Treatment ; Order Dt/Tm:   02/15/2016 12:07:32          sodium chloride 0.9% Inj Soln 10 mL syringe  :   sodium chloride 0.9% Inj Soln 10 mL syringe ; Status:   Ordered ; Ordered As Mnemonic:   sodium chloride 0.9% flush syringe range dose ; Simple Display Line:   30 mL, IV Push, q15min, PRN: other (see comment) ; Ordering Provider:   Neena Rhymes; Catalog Code:   sodium chloride flush ; Order Dt/Tm:   02/15/2016 12:07:33 ; Comment:   for access to sodium chloride 0.9% syringe for INT flush if needed          sodium chloride 0.9% Inj Soln 10 mL vial  :   sodium chloride 0.9% Inj Soln 10 mL vial ; Status:   Ordered ; Ordered As Mnemonic:   sodium chloride 0.9% vial for reconstitution range dose ; Simple Display Line:   30 mL, IV Push, q40min, PRN: other (see comment) ; Ordering Provider:   Neena Rhymes; Catalog Code:   sodium chloride flush ; Order Dt/Tm:   02/15/2016 12:07:32 ; Comment:   for access to sodium chloride 0.9% vial when needed as a diluent for reconstitutable medications          sterile water Inj Soln 10 mL  :   sterile water Inj Soln 10 mL ; Status:   Ordered ; Ordered As Mnemonic:   sterile water for reconstitution ; Simple Display Line:   10 mL, N/A, q91min, PRN: other (see comment) ; Ordering Provider:   Neena Rhymes; Catalog Code:   sterile water for reconstitution ; Order Dt/Tm:    02/15/2016 12:07:33 ; Comment:   to access sterile water when needed as a diluent for reconstitutable medications. Not for IV use.          clindamycin  :   clindamycin ; Status:   Completed ; Ordered As Mnemonic:   clindamycin IVPB ; Simple Display Line:   900 mg, 50 mL, 100 mL/hr, IV Piggyback, On Call ; Ordering Provider:   Audry Riles; Catalog Code:   clindamycin ; Order Dt/Tm:   02/15/2016 11:51:03          Lactated Ringers Injection IV Sol 500 mL  :   Lactated Ringers Injection IV Sol 500 mL ; Status:   Ordered ; Ordered As  Mnemonic:   Lactated Ringer Bolus ; Simple Display Line:   500 mL, IV Bolus, Once ; Ordering Provider:   Starleen Arms T-MD; Catalog Code:   Lactated Ringers Injection ; Order Dt/Tm:   01/28/2015 13:09:38            Home Meds    potassium chloride  :   potassium chloride ; Status:   Documented ; Ordered As Mnemonic:   Klor-Con M10 oral tablet, extended release ; Simple Display Line:   20 mEq, 2 tabs, Oral, qAM ; Catalog Code:   potassium chloride ; Order Dt/Tm:   11/01/2015 07:54:31          hydrochlorothiazide  :   hydrochlorothiazide ; Status:   Documented ; Ordered As Mnemonic:   hydrochlorothiazide ; Simple Display Line:   25 mg, Oral, qAM, 0 Refill(s) ; Catalog Code:   hydrochlorothiazide ; Order Dt/Tm:   08/10/2015 16:32:40          fluticasone nasal  :   fluticasone nasal ; Status:   Documented ; Ordered As Mnemonic:   Flonase 50 mcg/inh nasal spray ; Simple Display Line:   1 sprays, Nasal, Daily, PRN: allergic rhinitis/nasal congestion, 16 g, 0 Refill(s) ; Catalog Code:   fluticasone nasal ; Order Dt/Tm:   01/27/2015 14:55:52          loratadine  :   loratadine ; Status:   Documented ; Ordered As Mnemonic:   Claritin 10 mg oral tablet ; Simple Display Line:   10 mg, 1 tabs, Oral, qPM, PRN, 0 Refill(s) ; Catalog Code:   loratadine ; Order Dt/Tm:   01/27/2015 14:55:36            Safety   Environmental Safety in Place :   Bed in low position, Wheels locked, Call device within  reach, Personal items within reach, Adequate room lighting, Attend patient with toileting, Traffic path in room free of clutter, Encourage handrail/safety bar use, Encourage sensory support item use, Sensory aids within reach   Demonstrates Ability to Use Call Light :   Yes   Patient Identified :   Identification band, Verbal   Room Orientation/Facility Policy Reviewed :   Yes   Room Orientation/Facility Policy Reviewed With :   Patient, Family   Maisie Fus, RN, Darl Householder - 02/15/2016 18:27 EST

## 2016-02-15 NOTE — Nursing Note (Signed)
Medication Administration Follow Up-Text       Medication Administration Follow Up Entered On:  02/15/2016 23:12 EST    Performed On:  02/15/2016 23:12 EST by Rennis Petty, RN, STACEY A      Intervention Information:     ondansetron  Performed by Rennis Petty, RN, STACEY A on 02/15/2016 22:25:00 EST       ondansetron,4mg   IV Push,Hand, Right,nausea/vomiting       Medication Effectiveness Evaluation   Medication Administration Reason :   Nausea   Medication Effective :   Yes   Medication Response :   Symptoms improved, Continue to observe for symptoms   HERRON, RN, STACEY A - 02/15/2016 23:12 EST

## 2016-02-15 NOTE — Nursing Note (Signed)
Medication Administration Follow Up-Text       Medication Administration Follow Up Entered On:  02/15/2016 17:41 EST    Performed On:  02/15/2016 17:50 EST by Lorenda Ishihara, RN, Christine      Intervention Information:     hydromorphone  Performed by Lorenda Ishihara, RN, Christine on 02/15/2016 17:35:00 EST       hydromorphone,0.5mg   IV Push,Hand, Right,other (see comment)       Medication Effectiveness Evaluation   Medication Administration Reason :   Pain   Medication Response :   Continue to observe for symptoms   Lorenda Ishihara, RN, Christine - 02/15/2016 17:40 EST

## 2016-02-15 NOTE — Op Note (Signed)
Operative Report    [facility]. 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      PROCEDURE PERFORMED:  Preliminary breast reduction prior to  anticipated prophylactic mastectomies and reconstruction.      ASSISTANT:  Nunzio Cobbs, RN, first assistant      DESCRIPTION OF PROCEDURE:  The patient was carefully marked for the  planned procedure while in the standing position preoperatively.  She  was then taken to the operating room.  General anesthesia was induced.   The anterior torso was prepped and draped in the usual sterile  fashion.  The Wise pattern incisions were made.  The nipple-areolar  complex was circumscribed using a 42-mm cookie-cutter.  All  intervening skin was removed.  The Wise pattern flaps were then  elevated until the nipple could be elevated to its desired new  position.  The wounds were temporarily stapled closed.  Markings were  made for removal of additional skin as well as breast parenchyma.   This procedure was repeated on each side until adequate breast shape  and size and symmetry were achieved.  Meticulous hemostasis was then  achieved, with the patient's blood pressure elevated to just above  preoperative levels.  Closed suction drains were placed within the  wound and brought out through the lateralmost aspect of the incision.   The subcutaneous tissues were closed using interrupted 2-0 Vicryl  suture.  The skin and nipple-areolar were closed using running 3-0  subcuticular Monocryl suture.  Good perfusion was noted following  completion of the procedure.  Dermabond was applied to the vertical  and transverse suture lines.  A gently supportive postoperative  brassiere was applied, and the patient was awakened and taken to  recovery in good condition.      Nicanor Bake., MD  TR: *n DD: 02/15/2016 16:12 TD: 02/15/2016 18:45 Job#:  [id number]  \\[id number]#: [id number]  \\[id number]\\  Signature Line    Electronically Signed on 02/23/2016 10:13 AM EST  ________________________________________________  [facility]

## 2016-02-15 NOTE — Nursing Note (Signed)
Medication Administration Follow Up-Text       Medication Administration Follow Up Entered On:  02/15/2016 17:57 EST    Performed On:  02/15/2016 18:00 EST by Lorenda Ishihara, RN, Christine      Intervention Information:     hydromorphone  Performed by Lorenda Ishihara, RN, Christine on 02/15/2016 17:45:00 EST       hydromorphone,0.5mg   IV Push,Hand, Right,other (see comment)       Medication Effectiveness Evaluation   Medication Administration Reason :   Pain   Medication Response :   Continue to observe for symptoms   Lorenda Ishihara, RN, Christine - 02/15/2016 17:57 EST

## 2016-02-15 NOTE — Nursing Note (Signed)
Adult Patient History Form-Text       Adult Patient History Entered On:  02/15/2016 18:27 EST    Performed On:  02/15/2016 18:25 EST by Maisie Fus, RN, BRITNEY O               General Info   Admitted From :   [facility]   Mode of Arrival on Unit :   Bed   Accompanied By :   Spouse   In Clinical Trial With Signed Consent for Related Condition :   No signed consent for clinical trial   Information Given By :   Self   Primary Language :   English   Preferred Mode of Communication :   Verbal   Pregnancy Status :   Patient denies   Has the patient received chemotherapy or biotherapy within the last 48 hours? :   No   Is the patient currently (2-3 days) receiving radiation treatment? :   No   Maisie Fus RN, BRITNEY O - 02/15/2016 18:25 EST   Problem History   (As Of: 02/15/2016 18:27:13 EST)   Problems(Active)    At high risk for breast cancer (SNOMED CT  :[id number] )  Name of Problem:   At high risk for breast cancer ; Recorder:   LUTZ, RN, CARLA W; Confirmation:   Confirmed ; Classification:   Patient Stated ; Code:   [id number] ; Contributor System:   Dietitian ; Last Updated:   02/11/2016 9:25 EST ; Life Cycle Date:   02/11/2016 ; Life Cycle Status:   Active ; Vocabulary:   SNOMED CT        Cyst of kidney (SNOMED CT  :[id number] )  Name of Problem:   Cyst of kidney ; Recorder:   Alfonse Ras RN, Nicholos Johns; Confirmation:   Confirmed ; Classification:   Patient Stated ; Code:   [id number] ; Contributor System:   Dietitian ; Last Updated:   11/01/2015 7:57 EDT ; Life Cycle Date:   11/01/2015 ; Life Cycle Status:   Active ; Responsible Provider:   Elaina Hoops; Vocabulary:   SNOMED CT   ; Comments:        11/01/2015 7:57 - Alfonse Ras, RN, Nicholos Johns  RIGHT--SEEN ON CT--BEING FOLLOWED      Family history of cancer of colon (SNOMED CT  :[id number] )  Name of Problem:   Family history of cancer of colon ; Recorder:   Ham, RN, Nicholos Johns; Confirmation:   Confirmed ; Classification:   Patient Stated ; Code:   [phone] ; Contributor  System:   Dietitian ; Last Updated:   11/01/2015 7:55 EDT ; Life Cycle Date:   11/01/2015 ; Life Cycle Status:   Active ; Vocabulary:   SNOMED CT        Hypertension (SNOMED CT  :53664403 )  Name of Problem:   Hypertension ; Recorder:   CARSON, RN, MELISSA; Confirmation:   Confirmed ; Classification:   Medical ; Code:   [id number] ; Contributor System:   PowerChart ; Last Updated:   08/10/2015 16:31 EDT ; Life Cycle Date:   08/10/2015 ; Life Cycle Status:   Active ; Vocabulary:   SNOMED CT   ; Comments:        11/01/2015 7:55 - Alfonse Ras, RN, Nicholos Johns  CONTROLLED      Intermittent abdominal pain (SNOMED CT  :38756433 )  Name of Problem:   Intermittent abdominal pain ; Recorder:   LUTZ, RN, CARLA W; Confirmation:   Confirmed ; Classification:  Patient Stated ; Code:   [id number] ; Contributor System:   Dietitian ; Last Updated:   02/11/2016 9:26 EST ; Life Cycle Date:   02/11/2016 ; Life Cycle Status:   Active ; Vocabulary:   SNOMED CT        Post-operative nausea and vomiting (SNOMED CT  :6269485 )  Name of Problem:   Post-operative nausea and vomiting ; Recorder:   Kennieth Rad; Confirmation:   Confirmed ; Classification:   Patient Stated ; Code:   [id number] ; Contributor System:   PowerChart ; Last Updated:   01/27/2015 14:57 EST ; Life Cycle Date:   01/27/2015 ; Life Cycle Status:   Active ; Vocabulary:   SNOMED CT        Seasonal allergies (SNOMED CT  [phone] )  Name of Problem:   Seasonal allergies ; Recorder:   Kennieth Rad; Confirmation:   Confirmed ; Classification:   Patient Stated ; Code:   [phone] ; Contributor System:   PowerChart ; Last Updated:   01/27/2015 14:57 EST ; Life Cycle Date:   01/27/2015 ; Life Cycle Status:   Active ; Vocabulary:   SNOMED CT          Diagnoses(Active)    At high risk for breast cancer  Date:   02/14/2016 ; Diagnosis Type:   Discharge ; Confirmation:   Confirmed ; Clinical Dx:   At high risk for breast cancer ; Classification:    Patient Stated ; Clinical Service:   Non-Specified ; Code:   ICD-10-CM ; Probability:   0 ; Diagnosis Code:   Z91.89      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  Date:   02/14/2016 ; Diagnosis Type:   Pre-Op Diagnosis ; Confirmation:   Confirmed ; Clinical Dx:   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 ; Classification:   Medical ; Clinical Service:   Non-Specified ; Code:   ICD-10-CM ; Probability:   0 ; Diagnosis Code:   Z15.01        Family History   Family History   (As Of: 02/15/2016 18:27:14 EST)     Immunizations   Influenza Vaccine Status :   Received prior to admission, during current flu season   Last Tetanus :   Less than 5 years   [place], Harless Nakayama - 02/15/2016 18:25 EST   ID Risk Screen   Patient Recent Travel History :   No recent travel   Family Member Travel History :   No recent travel   [place], [place] - 02/15/2016 18:25 EST   Infectious Disease Risk Factor Grid   Chills :   No   Fever :   No   Fatigue :   No   Headache :   No   Runny or Stuffy Nose :   Yes   Sore Throat :   Yes   Difficulty Breathing :   No   Shortness of Breath :   No   New or Worsening Cough :   No   Wheezing :   No   Vomiting :   No   Diarrhea :   No   Abdominal (Stomach Pain) :   No   Muscle Pain :   No   Weakness/Numbness :   No   Recent Exposure to Communicable Disease :   No   Illness With Generalized Rash :   No   Abnormal Bleeding :   No   Unexplained  Hemorrhage (Bleeding or Bruising) :   No   Arthralgia :   No   Conjunctivitis :   No   THOMAS, RN, Darl Householder - 02/15/2016 18:25 EST   MRSA/VRE Screening :   No   THOMAS, RN, BRITNEY O - 02/15/2016 18:25 EST   Chills :   No   Cough (Any Duration) :   No   Fever :   No   Hemoptysis (Blood in Sputum) :   No   Night Sweats :   No   Weight Loss Greater Than 10 Pounds :   No   Hx of TB Now or at Any Time In the Past (Even if on Meds) :   No   Foreign-Born :   No   Homeless or In Shelter :   No   Incarcerated Within Last 2 Years :   No   Intravenous Drug User :   No   Female  Homosexual :   No   New TST/IGRA Results Pos(Within 2 yrs),Hx Recent TB Exposure :   No   THOMAS, RN, BRITNEY O - 02/15/2016 18:25 EST   Does the Patient Have any of the Following Conditions That Compromise the Immune System :   None   THOMAS, RN, BRITNEY O - 02/15/2016 18:25 EST   C. diff Screen   Have you had 3 or more loose/watery stool in 24 hours? :   No   Maisie Fus RN, Darl Householder - 02/15/2016 18:25 EST   Procedure History        -    Procedure History   (As Of: 02/15/2016 18:27:14 EST)     Anesthesia Minutes:   0 ; Procedure Name:   Excision of cyst of breast ; Procedure Minutes:   0 ; Comments:     11/01/2015 07:58 - Alfonse Ras, RN, [employer] ; Last Reviewed Dt/Tm:   02/15/2016 18:26:38 EST            Procedure Dt/Tm:   01/28/2015 12:33:00 EST ; Location:   [facility] ; Provider:   Josiah Lobo A.-MD; Anesthesia Type:   Monitored Anesthesia Care ; :   Starleen Arms T-MD; Anesthesia Minutes:   0 ; Procedure Name:   Wrist Ganglion Cyst Excision ; Procedure Minutes:   29 ; Comments:     01/28/2015 13:11 - LIVINGSTON, RN, DOROTHY  auto-populated from documented surgical case ; Clinical Service:   Surgery ; Last Reviewed Dt/Tm:   02/15/2016 18:26:38 EST            Anesthesia Minutes:   0 ; Procedure Name:   Cyst of eyelid of left eye ; Procedure Minutes:   0 ; Last Reviewed Dt/Tm:   02/15/2016 18:26:38 EST            Anesthesia Minutes:   0 ; Procedure Name:   Sling procedure of bladder neck ; Procedure Minutes:   0 ; Last Reviewed Dt/Tm:   02/15/2016 18:26:38 EST            Anesthesia Minutes:   0 ; Procedure Name:   Arthroscopy of knee ; Procedure Minutes:   0 ; Comments:     01/27/2015 15:00 - Kennieth Rad  RIGHT ; Last Reviewed Dt/Tm:   02/15/2016 18:26:38 EST            Anesthesia Minutes:   0 ; Procedure Name:   Hysterectomy ; Procedure Minutes:   0 ; Last Reviewed Dt/Tm:  02/15/2016 18:26:38 EST            Procedure Dt/Tm:   11/01/2015 08:43:00 EDT ; Location:   [facility] ; Provider:   Ronnell Freshwater D; Anesthesia  Type:   Monitored Anesthesia Care ; :   Georgiana Shore; Anesthesia Minutes:   0 ; Procedure Name:   Colonoscopy with Polyp Cold ; Procedure Minutes:   15 ; Comments:     11/01/2015 08:59 - PRICE,  MICHELLE A-RN  auto-populated from documented surgical case ; Clinical Service:   Surgery ; Last Reviewed Dt/Tm:   02/15/2016 18:26:38 EST            Procedure Dt/Tm:   02/15/2016 14:09:00 EST ; Location:   [facility] ; Provider:   Neena Rhymes; Anesthesia Type:   General ; :   PHELPS-MD,  SARAH; Anesthesia Minutes:   0 ; Procedure Name:   Breast Reduction (Bilateral) ; Procedure Minutes:   165 ; Comments:     02/15/2016 17:03 - Louis Matte H  auto-populated from documented surgical case ; Clinical Service:   Surgery ; Last Reviewed Dt/Tm:   02/15/2016 18:26:38 EST            Anesthesia/Sedation   Anesthesia History :   Prior general anesthesia   Anesthesia Reaction :   Excessive post op nausea, Vomiting   Moderate Sedation History :   Prior sedation for procedure   Previous Problem With Sedation :   None   THOMAS, RN, Darl Householder - 02/15/2016 18:25 EST   Transfusion/Bloodless Med   Transfusion History :   No prior transfusion   Will Patient Accept Blood Transfusion and/or Blood Products :   Yes   THOMAS, RN, BRITNEY O - 02/15/2016 18:25 EST   Nutrition   Appetite :   Good   Feeding Ability :   Complete independence   THOMAS, RN, Darl Householder - 02/15/2016 18:25 EST   Functional   Cognitive Function Concerns Prior to Admission :   None reported   Fear of Falling :   No   Ability to Ambulate Prior to Admission :   Independent   Ability to Move in Bed Prior to Admission :   Independent   Glenford Peers - 02/15/2016 18:25 EST   Social History   Social History   (As Of: 02/15/2016 18:27:14 EST)   Tobacco:        Never smoker   (Last Updated: 02/15/2016 18:27:03 EST by Maisie Fus, RN, BRITNEY O)          Alcohol:        Current, 1-2 times per week   (Last Updated: 01/27/2015 15:03:11 EST by Kennieth Rad)           Substance Abuse:        Denies   (Last Updated: 01/27/2015 15:03:17 EST by Kennieth Rad)            Sexual Assault/Domestic Violence Screen   Have You Been Physically Hurt by Someone Within the Past Year :   No   Maisie Fus, RN, Darl Householder - 02/15/2016 18:25 EST   Spiritual   Hospital Chaplain to Visit :   No   Spiritual Leader to be Notified :   No   Maisie Fus RN, Darl Householder - 02/15/2016 18:25 EST   Advance Directive   *Advance Directive :   No   THOMAS, RN, Darl Householder - 02/15/2016 18:25 EST

## 2016-02-15 NOTE — Procedures (Signed)
IntraOp Record - [facility]             IntraOp Record - SFOR Summary                                                                   Primary Physician:        Driscilla Moats                              [facility]    Case Number:              [id number]    Finalized Date/Time:      02/15/16 17:03:32    Pt. Name:                 [name] [name], [name] [name]    D.O.B./Sex:               [birth year]    Female    Med Rec #:                [id number]    Physician:                Driscilla Moats                              [facility]    Financial #:              [id number]    Pt. Type:                 O    Room/Bed:                 /    Admit/Disch:              02/15/16 11:21:00 -    Institution:       [facility] - Case Times                                                                                                         Entry 1                                                                                                          Patient      In Room Time  02/15/16 13:42:00               Out Room Time                   02/15/16 16:58:00    Anesthesia     Procedure      Start Time               02/15/16 14:09:00               Stop Time                       02/15/16 16:54:00    Last Modified By:         Clyde Lundborg                              02/15/16 17:01:23      SFOR - Case Times Audit                                                                          02/15/16 17:01:23         Owner: Tobi Bastos                               Modifier: [email or web address]                                                       <+> 1         Out Room Time        <+> 1         Stop Time        SFOR - Safety Checklist - Sign In                                                               Pre-Care Text:            A.10 Confirms patient identity A.20 Verifies operative procedure, surgical site, and laterality A.20.1 Verifies           consent for planned procedure A.30 Verifies allergies                              Entry 1  History/Physical on       Yes                             Procedure Consent               Yes    Chart                                                     on Chart     Site Marked (if           Yes    applicable)     Last Modified By:         Mike Craze                              02/15/16 14:22:29    Post-Care Text:            E.30 Evaluates verification process for correct patient, site, side, and level surgery      SFOR - Case Attendance                                                                                                    Entry 1                         Entry 2                         Entry 3                                          Case Attendee             Weather[place],  Rose Phi, RN, [place],  [place]    Role Performed            Surgeon Primary                 Circulator                      Surgical Scrub    Time In                   02/15/16 13:42:00               02/15/16 13:42:00  02/15/16 13:42:00    Time Out                  02/15/16 16:58:00               02/15/16 16:12:00               02/15/16 16:58:00    Procedure                 Breast                          Breast                          Breast                              Reduction(Bilateral)            Reduction(Bilateral)            Reduction(Bilateral)    Last Modified By:         Sol Passer H                              02/15/16 17:02:20               02/15/16 17:02:20               02/15/16 17:02:20                                Entry 4                         Entry 5                         Entry 6                                          Case Attendee             Lyda Kalata, RN, Christine         HENSLEY-CRNA,                   PHELPS-MD,  [facility]                               A                                CHRISTOPHER    Role Performed            Surgical Scrub Add'l            CRNA                            Anesthesiologist    Time  In                   02/15/16 13:42:00               02/15/16 13:42:00               02/15/16 13:42:00    Time Out                  02/15/16 16:58:00               02/15/16 16:58:00               02/15/16 16:58:00    Procedure                 Breast                          Breast                          Breast                              Reduction(Bilateral)            Reduction(Bilateral)            Reduction(Bilateral)    Last Modified By:         Sol Passer H                              02/15/16 17:02:20               02/15/16 17:02:20               02/15/16 17:02:20                                Entry 7                                                                                                          Case Attendee             Charleston Ropes H    Role Performed            Circulator Relief    Time In                   02/15/16 16:11:00    Time Out                  02/15/16 16:58:00    Procedure                 Breast  Reduction(Bilateral)    Last Modified By:         Charleston Ropes H                              02/15/16 17:02:20      SFOR - Case Attendance Audit                                                                     02/15/16 17:02:20         Owner: Tobi Bastos                               Modifier: [email or web address]                                                           1     <+> Time Out            1     <*> Procedure                              Breast Reduction(Bilateral)            2     <*> Procedure                              Breast Reduction(Bilateral)            3     <+> Time Out            3     <*> Procedure                              Breast Reduction(Bilateral)            4     <+> Time Out            4     <*> Procedure                               Breast Reduction(Bilateral)            5     <+> Time Out            5     <*> Procedure                              Breast Reduction(Bilateral)            6     <+> Time Out            6     <*> Procedure  Breast Reduction(Bilateral)            7     <+> Time Out            7     <*> Procedure                              Breast Reduction(Bilateral)     02/15/16 16:11:57         Owner: HANNSA                               Modifier: HANNSA                                                            1     <*> Procedure                              Breast Reduction(Bilateral)            2     <+> Time In            2     <+> Time Out            2     <*> Procedure                              Breast Reduction(Bilateral)            3     <+> Time In            3     <*> Procedure                              Breast Reduction(Bilateral)            4     <+> Time In            4     <*> Procedure                              Breast Reduction(Bilateral)            5     <+> Time In            5     <*> Procedure                              Breast Reduction(Bilateral)            6     <+> Time In            6     <*> Procedure                              Breast Reduction(Bilateral)        <+> 7         Case Attendee        <+>  7         Role Performed        <+> 7         Time In        <+> 7         Procedure     02/15/16 14:23:37         Owner: HANNSA                               Modifier: HANNSA                                                            1     <+> Time In            1     <*> Procedure                              Breast Reduction(Bilateral)        <+> 2         Case Attendee        <+> 2         Role Performed        <+> 2         Procedure        <+> 3         Case Attendee        <+> 3         Role Performed        <+> 3         Procedure        <+> 4         Case Attendee        <+> 4         Role Performed        <+> 4         Procedure        <+> 5          Case Attendee        <+> 5         Role Performed        <+> 5         Procedure        <+> 6         Case Attendee        <+> 6         Role Performed        <+> 6         Procedure        SFOR - Skin Assessment                                                                          Pre-Care Text:            A.240 Assesses baseline skin condition Im.120 Implements protective measures to  prevent skin or tissue injury           due to mechanical sources  Im.280.1 Implements progective measures to prevent skin or tissue injury due to           thermal sources Im.360 Monitors for signs and symptons of infection                              Entry 1                                                                                                          Skin Integrity            Intact    Last Modified By:         Orvil Feil, RN, Arleta Creek                              02/15/16 14:23:42    Post-Care Text:            E.10 Evaluates for signs and symptoms of physical injury to skin and tissue E.270 Evaluate tissue perfusion           O.60 Patient is free from signs and symptoms of injury caused by extraneous objects   O.210 Patinet's tissue           perfusion is consistent with or improved from baseline levels      SFOR - Patient Positioning                                                                      Pre-Care Text:            A.240 Assesses baseline skin condition A.280 Identifies baseline musculoskeletal status A.280.1 Identifies           physical alterations that require additional precautions for procedure-specific positioning A.510.8 Maintains           patient's dignity and privacy Im.120 Implements protective measures to prevent skin/tissue injury due to           mechanical sources Im.40 Positions the patient Im.80 Applies safety devices                              Entry 1  Procedure                 Breast                           Body Position                   Supine                              Reduction(Bilateral)    Left Arm Position         Extended on Padded Arm          Right Arm Position              Extended on Padded Arm                              Board w/Security Strap                                          Board w/Security Strap    Left Leg Position         Extended Security               Right Leg Position              Extended Security                              Strap, Pillow Under Knee                                        Strap, Pillow Under Knee    Feet Uncrossed            Yes                             Pressure Points                 Yes                                                              Checked     Additional                ARMS WRAPPED LOOSELY IN         Positioning Device              Arm Cradle Foam/Gel,    Information               KERLIX                                                          Heel pad, Pillow  Positioned By             Orvil Feil, RN, Samantha L,          Outcome Met (O.80)              Yes                              HENSLEY-CRNA,                              CHRISTOPHER, KLINE-MD,                              RICHARD MAHLON    Last Modified By:         Orvil Feil, RN, Arleta Creek                              02/15/16 14:24:01    Post-Care Text:            E.10 Evaluates for signs and symptoms of physical injury to skin and tissue E.290 Evaluates musculoskeletal           status O.80 Patient is free from signs and symptoms of injury related to positioning O.120 the patient is free           from signs and symptoms of injury related to transfer/transport  O.250 Patient's musculoskeletal status is           maintained at or improved from baseline levels      SFOR - Skin Prep                                                                                Pre-Care Text:            A.30 Verifies allergies A.20 Verifies procedure, surgical site, and laterality A.510.8  Maintains paritnet's           dignity and privacy Im.270 Performs Skin Preparation Im.270.1 Implements protective measures to prevent skin           and tissue injury due to chemical sources  A.300.1 Protects from cross-contamination                              Entry 1                                                                                                          Hair Removal     Skin Prep      Prep Agents (  Im.270)     Chlorhexidine Gluconate         Prep Area (Im.270)              Chest                              2% w/Alcohol     Prep By                  Lyda Kalata, RN, Christine                              A    Outcome Met (O.100)       Yes    Last Modified By:         Orvil Feil, RN, Arleta Creek                              02/15/16 14:24:21    Post-Care Text:            E.10 Evaluates for signs and symptoms of physical injury to skin and tissue O.100 Patient is free from signs           and symptoms of chemical injury  O.740 The patient's right to privacy is maintained    General Comments:            BED TO BED, CHIN TO UMBILICUS      SFOR - Counts Initial and Final                                                                 Pre-Care Text:            A.29 Verifies operative procedure, sugical site, and laterality A.20.2 Assesses the risk for unintended           retained foreign body Im.20 Performs required counts                              Entry 1                                                                                                          Initial Counts      Initial Counts           Orvil Feil, RN, Samantha L,          Items included in               Sponges, Sharps     Performed By             Ebony Cargo  the Initial Count     Final Counts      Final Counts             Orvil Feil, RN, Aldona Bar L,          Final Count Status              Correct     Performed By             Donata Clay K     Items Included in        Sponges, Sharps     Final Count     Outcome Met (O.20)         Yes    Last Modified By:         Orvil Feil, RN, Arleta Creek                              02/15/16 15:37:00    Post-Care Text:            E.50 Evaluates results of the surgical count O.20 Patient is free from unintended retained foreign objects      SFOR - Counts Initial and Final Audit                                                            02/15/16 15:37:00         Owner: HANNSA                               Modifier: HANNSA                                                        <+> 1         Final Counts Performed By        <+> 1         Final Count Status        <+> 1         Items Included in Final Count        <+> 1         Outcome Met (O.20)        SFOR - Counts Additional                                                                        Pre-Care Text:            A.69 Verifies operative procedure, sugical site, and laterality A.20.2 Assesses the risk for unintended           retained foreign body Im.20 Performs required counts  Entry 1                                                                                                          Additional Count          Closing Count                   Additional Count                Orvil Feil, RN, Aldona Bar L,    Type                                                      Participants                    TISDALE,  DANA K    Count Status              Correct                         Items Counted                   Sponges, Sharps    Outcome Met (O.20)        Yes    Last Modified By:         Orvil Feil, RN, Arleta Creek                              02/15/16 15:23:55    Post-Care Text:            E.50 Evaluates results of the surgical count O.20 Patient is free from unintended retained foreign objects      SFOR - General Case Data                                                                        Pre-Care Text:            A.350.1 Classifies surgical wound                              Entry 1  Case Information      ASA Class                2                               Case Level                      Level 3     OR                       SF 02                           Specialty                       Plastic (SN)     Wound Class              1-Clean    Preop Diagnosis           Z15.01 / Z80.3    Last Modified By:         Orvil Feil RN, Arleta Creek                              02/15/16 14:27:44    Post-Care Text:            O.760 Patient receives consistent and comparable care regardless of the setting      SFOR - Fire Risk Assessment                                                                                               Entry 1                                                                                                          Fire Risk                 Alcohol Based Prep              Fire Risk Score                 3    Assessment: If            Solution, Surgical Site    checked, checkmark        Above Xiphoid, Ignition    = 1 point                 Source In Use  Last Modified By:         Orvil Feil, RN, Arleta Creek                              02/15/16 14:27:54      SFOR - Safety Checklist - Time Out                                                              Pre-Care Text:            A.10 Confirms patient identity A.20 Verifies operative procedure, surgical site, and laterality A.20.1 Verifies           consent for planned procedure A.30 Verifies allergies                              Entry 1                                                                                                          Surgical/Procedure        Yes                             Time Out Complete               02/15/16 14:06:00    Team confirms     correct patient,     correct site and     correct procedure     Last Modified By:         Orvil Feil RN, Arleta Creek                              02/15/16 14:28:06    Post-Care Text:            E.30 Evaluates verification process for  correct patient, site, side, and level surgery      SFOR - Cautery                                                                                  Pre-Care Text:            A.240 Assesses baseline skin condition A280.1 Identifies baseline musculoskeletal status Im.50 Implements           protective measures to prevent injury due to electrical sources  Im.60 Uses supplies and equipment within safe  parameters Im.80 Applies safety devices                              Entry 1                                                                                                          ESU Type                  GENERATOR                       Identification                  [id number]                              COVIDIEN/[facility]LEYLAB              Number     Coag Setting (watts)      40                              Cut Setting (watts)             25    Grounding Pad             Yes                             Grounding Pad Site              Thigh, right    Needed?     Grounding Evangeline Dakin, RN, Aldona Bar L           Outcome Met (O.10)              Yes    Applied By     Last Modified By:         Orvil Feil, RN, Arleta Creek                              02/15/16 14:29:01    Post-Care Text:            E.10 Evaluates for signs and symptoms of physical injury to skin and tissue O.10 Patient is free from signs and           symptoms of injury related to thermal sources  O.70 Patient is free from signs and symptoms of electrical injury      [facility] - Patient Care Devices  Pre-Care Text:            A.200 Assesses risk for normothermia regulation A.40 Verifies presence of prosthetics or corrective devices           Im.280 Implements thermoregulation measures Im.60 Uses supplies and equipment within safe parameters                              Entry 1                                                                                                          Equipment  Type            MACHINE SEQUENTIAL              SCD Sleeve Site                 Legs Bilateral                              COMPRESSION    Equipment/Tag Number      [id number]                          Initiated Pre                   Yes                                                              Induction     Last Modified By:         Orvil Feil, RN, Arleta Creek                              02/15/16 14:31:01    Post-Care Text:            E.10 Evaluates signs and symptoms of physical injury to skin and tissue O.60 Patient is free from signs and           symptoms of injury caused by extraneous objects      SFOR - Specimens                                                                                                          Entry  1                         Entry 2                                                                          Description               LEFT BREAST TISSUE              RIGHT BREAST TISSUE    Specimen Type             Routine                         Routine    Date/Time in     Formalin (for     breast tissue only)     Last Modified By:         Orvil Feil, RN, Alfredo Martinez, RN, Arleta Creek                              02/15/16 14:52:58               02/15/16 14:52:58      SFOR - Tubes, Drains, Catheters                                                                 Pre-Care Text:            A.310 Identifies factors associated with an increased risk for hemorrhage or fluid and electrolyte imbalance           Im.250 Administers care to invasive device sites                              Entry 1                                                                                                          Device Description        [place]             Location                        Breast  PRATT 15FR W/ TROCAR    Location Detail           Bilateral    Last Modified By:         Orvil Feil, RN, Arleta Creek                              02/15/16 15:26:50    Post-Care Text:             E.340 Evaluates tubes and drains are intact and functioning as planned O.60 Patient is free from signs and           symptoms of injury caused by extraneous objects      SFOR - Tubes, Drains, Catheters Audit                                                            02/15/16 15:26:50         Owner: HANNSA                               Modifier: HANNSA                                                            1     <*> Device Description                     DRAIN WOUND FLAT 10MM W/ 15FR TROCAR FULL CHANNEL     02/15/16 15:24:20         Owner: HANNSA                               Modifier: HANNSA                                                            1     <*> Device Description                     DRAIN WOUND [place] PRATT 15FR W/ TROCAR        SFOR - Communication                                                                            Pre-Care Text:            A.520 Identifies barriers to communication (Patient and Family Communications) A.20 Verifies operative           procedure, surgical site, and laterality Education officer, museum)  Im.500 Provides status reports to family           members Im.150 Develops individualized plan of care                              Entry 1                         Entry 2                                                                          Communication             Phone Call                      Phone Call    Communication By          Orvil Feil, [place], Alfredo Martinez, RN, Arleta Creek    Date and Time             02/15/16 14:58:00               02/15/16 15:36:00    Communication To          [place]    Last Modified By:         Orvil Feil, RN, Alfredo Martinez, RN, Arleta Creek                              02/15/16 14:58:38               02/15/16 15:36:38    Post-Care Text:            E.520 Evaluates psychosocial response to plan of care O.500 Patient or designated support person demonstrates           knowledge of the  expected psychosocial responses to the procedure E.800 Ensures continuity of care O.50           Patient's current status is communicated throughout the continuum of care      [facility] - Communication Audit                                                                       02/15/16 15:36:38         Owner: HANNSA                               Modifier: HANNSA                                                        <+>  2         Communication        <+> 2         Communication By        <+> 2         Date and Time        <+> 2         Communication To        [facility] - Dressing/Packing                                                                         Pre-Care Text:            A.350 Assesses susceptibility for infection Im.250 Administers care to invasive devices Im.290 Administer care           to wound sites  Im.300 Implements aseptic technique                              Entry 1                                                                                                          Site                      Breast                          Site Details                    Bilateral    Dressing Item     Details      Dressing Item            Liquid Bandage,                 Miscellaneous                   Post Surgical Bra     (Im.290)                 Medicated Gauze                 (Im.290)     Last Modified By:         Clyde Lundborg                              02/15/16 16:50:43    Post-Care Text:            E.320 Evaluate factors associted with increased risk for postoperative infection at the completion of the  procedure O.200 Patient's wound perfusion is consistent with or improved from baseline levels  O.Patient is           free from signs and symptoms of infection      S[facility] - Dressing/Packing Audit                                                                    02/15/16 16:50:43         Owner: HANNSA                               Modifier: [email or web address]                                                            1     <*> Dressing Item (Im.290)                 Liquid Bandage            1     <+> Miscellaneous (Im.290)        S[facility] - Procedures                                                                               Pre-Care Text:            A.20 Verifies operative procedure, surgical site, and laterality Im.150 Develops individualized plan of care                              Entry 1                                                                                                          Procedure     Description      Procedure                Breast Reduction                Modifiers                       Bilateral     Surgical Procedure       BILATERAL MAMMAPLASTY     Text  Primary Procedure         Yes                             Primary Surgeon                 Driscilla Moats                                                                                              [facility]    Start                     02/15/16 14:09:00               Stop                            02/15/16 16:54:00    Anesthesia Type           General                         Surgical Service                Plastic (SN)    Wound Class               1-Clean    Last Modified By:         Clyde Lundborg                              02/15/16 17:01:49    Post-Care Text:            O.730 The patient's care is consistent with the individualized perioperative plan of care    General Comments:            82G REMOVED FROM LEFT, 66G REMOVED FROM RIGHT      [facility] - Procedures Audit                                                                          02/15/16 17:01:49         Owner: HANNSA                               Modifier: [email or web address]                                                       <+> 1         Stop  [facility] - Safety Checklist - Sign Out                                                              Pre-Care Text:            Im.330 Manages specimen handling and disposition                              Entry 1                                                                                                           Patient Safety            Yes    Communication Guide     Used Throughout Case     Last Modified By:         Orvil Feil, RN, [place]                              02/15/16 15:36:40    Post-Care Text:            E.800 Ensures continuity of care E.50 Evaluates results of the surgical count O.30 Patient's procedure is           performed on the correct site, side, and level O.50 patient's current status is communicated throughout the           continuum of care O.40 Patient's specimen(s) is managed in the appropriate manner      [facility] - Transfer                                                                                                           Entry 1  Transferred By            [employer],                   Via                             Stretcher                              CHRISTOPHER, Charleston Ropes H    Post-op Destination       PACU    Skin Assessment      Condition                Intact    Last Modified By:         Mike Craze                              02/15/16 16:12:33      SFOR - Transfer Audit                                                                            02/15/16 16:12:33         Owner: Tobi Bastos                               Modifier: HANNSA                                                            1     <*> Transferred By                         HENSLEY-CRNA,  CHRISTOPHER        Case Comments                                                                                         <None>              Finalized By: Clyde Lundborg      Document Signatures  Signed By:           Charleston Ropes H 02/15/16 17:03

## 2016-02-16 NOTE — Case Communication (Signed)
CM Discharge Planning Assessment - Text       CM Discharge Planning Assessment Entered On:  02/16/2016 14:06 EST    Performed On:  02/16/2016 11:15 EST by Mayer Camel, RN, MAKEEDA M               Home Environment   Living Environment :   No Living Environment Information Available     Affect/Behavior :   Appropriate   Lives With :   Family   Lives In :   Multilevel home   Living Situation :   Home with family support   Key Contact Information :   Dr. Sharlee Blew   Outside Facility Information :   [facility]   Barriers at Home :   None   TATUM, RN, [facility] M - 02/16/2016 14:01 EST   Home Environment   ADLs :   Minimal assist   TATUM, RN, MAKEEDA M - 02/16/2016 14:01 EST   Discharge Needs I   Previously Documented Discharge Needs :   DISCHARGE PLAN/NEEDS:No discharge data available.  EQUIPMENT/TREATMENT NEEDS:No discharge data available.     Previously Documented Benefits Information :   No discharge data available.     Anticipated Discharge Date :   02/17/2016 EST   Discharge To :   Home with family support   Home Caregiver Name/Relationship :   Brizeida Zecca Husband [phone]   CM Progress Note :   02/16/16 MY- 50yo female POD #1 bilateral breast reduction and lift. Lives with family, independent. Uses no DME, adequate support in the home. HOme is multilevels with many stairs but patient states this is not a barrier and pain is well controlled. Denies barriers to care or prescriptions. OBS status. Will continue to monitor for CM needs as indicated     TATUM, RN, Pincus Sanes - 02/16/2016 14:01 EST   Discharge Needs II   Professional Skilled Services :   No Needs   Needs Assistance with Transportation :   No   Needs Assistance at Home Upon Discharge :   No   Discharge Planning Time Spent :   20 minutes   Mayer Camel RN, [facility] Sanes - 02/16/2016 14:01 EST   Benefits   Insurance Information :   Private Insurance   [place], [place], [place] M - 02/16/2016 14:01 EST   Advance Directive   *Advance Directive :   No   TATUM, RN, MAKEEDA M -  02/16/2016 14:01 EST

## 2016-02-16 NOTE — Discharge Summary (Signed)
 Inpatient Patient Summary               [facility]  [address]  [place], [place] [address]  [phone]  Patient Discharge Instructions     Name: [name] [name], [name] [name]  Current Date: 02/16/2016 12:43:52  DOB: June 20, 1965 MRN: [id number] FIN: [id number]  Patient Address: [name] [place] [facility] [address]  Patient Phone: [phone]  Primary Care Provider:  Name: [name] [name]  Phone: [phone]   Immunizations Provided:       Discharge Diagnosis: At high risk for breast cancer  Discharged To: TO, ANTICIPATED%>Home with family support  Home Treatments: TREATMENTS, ANTICIPATED%>  Devices/Equipment: EQUIPMENT REHAB%>  Post Hospital Services: HOSPITAL SERVICES%>  Professional Skilled Services: SKILLED SERVICES%>  Therapist, sports and Community Resources:               SERV AND COMM RES, ANTICIPATED%>  Mode of Discharge Transportation: TRANSPORTATION%>  Discharge Orders         Discharge Patient 02/16/16 11:30:00 EST, Discharge Home/Self Care, Dr. Clide Cliff will be stopping by between 11 and 11:30 to see her before she goes home.         Comment:      Medications   During the course of your visit, your medication list was updated with the most current information. The details of those changes are reflected below:         Medications that have not changed  Other Medications  fluticasone nasal (Flonase 50 mcg/inh nasal spray) 1 Sprays Nasal (into the nose) every day as needed allergic rhinitis/nasal congestion.  Last Dose:____________________  hydrochlorothiazide 25 Milligram Oral (given by mouth) once a day (in the morning).  Last Dose:____________________  loratadine (Claritin 10 mg oral tablet) 1 Tabs Oral (given by mouth) once a day (in the evening) as needed.  Last Dose:____________________  potassium chloride (Klor-Con M10 oral tablet, extended release) 2 Tabs Oral (given by mouth) once a day (in the morning).  Last Dose:____________________         [facility] would like  to thank you for allowing [place] to assist you with your healthcare needs. The following includes patient education materials and information regarding your injury/illness.     [name], [name] has been given the following list of follow-up instructions, prescriptions, and patient education materials:  Follow-up Instructions:             With: Address: When:   [address]  [place], [place]  [address]  [phone] 02/18/2016 00:00:00                    Type Location Start [place]   [place] Tomo Screening Mammogram [facility] Mammo 02/24/2016 14:50:00 02/24/2016 15:00:00 Confirmed              It is important to always keep an active list of medications available so that you can share with other providers and manage your medications appropriately. As an additional courtesy, we are also providing you with your final active medications list that you can keep with you.           fluticasone nasal (Flonase 50 mcg/inh nasal spray) 1 Sprays Nasal (into the nose) every day as needed allergic rhinitis/nasal congestion.  hydrochlorothiazide 25 Milligram Oral (given by mouth) once a day (in the morning).  loratadine (Claritin 10 mg oral tablet) 1 Tabs Oral (given by mouth) once a day (in the evening) as needed.  potassium chloride (Klor-Con M10 oral tablet, extended release) 2 Tabs Oral (given by mouth) once a day (in the morning).      Take only the medications listed above. Contact your doctor prior to taking any medications not on this list.        Discharge instructions, if any, will display below     Instructions for Diet: INSTRUCTIONS FOR DIET%>   Instructions for Supplements: SUPPLEMENT INSTRUCTIONS%>   Instructions for Activity: INSTRUCTIONS FOR ACTIVITY%>   Instructions for Wound Care: INSTRUCTIONS FOR WOUND CARE%>     Medication leaflets, if any, will display below     Patient education materials, if any, will display below           IS IT A STROKE? Act FAST and Check for these signs:    FACE                          Does the face look uneven?    ARM                         Does one arm drift down?    SPEECH                    Does their speech sound strange?    TIME                         Call 9-1-1 at any sign of stroke  ---------------------------------------------------------------------------------------------------------------------------  Heart Attack Signs  Chest discomfort: Most heart attacks involve discomfort in the center of the chest and lasts more than a few minutes, or goes away and comes back. It can feel like uncomfortable pressure, squeezing, fullness or pain.  Discomfort in upper body: Symptoms can include pain or discomfort in one or both arms, back, neck, jaw or stomach.  Shortness of breath: With or without discomfort.  Other signs: Breaking out in a cold sweat, nausea, or lightheaded.  Remember, MINUTES DO MATTER. If you experience any of these heart attack warning signs, call 9-1-1 to get immediate medical attention!     ---------------------------------------------------------------------------------------------------------------------------  [facility] allows you to manage your health, view your test results, and retrieve your discharge documents from your hospital stay securely and conveniently from your computer.  To begin the enrollment process, visit [email or web address] Click on ???Sign up now??? under [employer]           ______________________________                                 ___________________    Patient/Family/ Caregiver Signature                                                           Date/Time     ______________________________                                 ___________________    Provider Signature  Date/Time

## 2016-02-16 NOTE — Nursing Note (Signed)
Medication Administration Follow Up-Text       Medication Administration Follow Up Entered On:  02/16/2016 2:37 EST    Performed On:  02/16/2016 2:36 EST by HERRON, RN, STACEY A      Intervention Information:     acetaminophen  Performed by Rennis PettyHERRON, RN, STACEY A on 02/16/2016 01:36:00 EST       acetaminophen,650mg   Oral,mild pain (1-3)       Medication Effectiveness Evaluation   Medication Administration Reason :   Pain   Medication Effective :   Yes   Medication Response :   Symptoms improved, Continue to observe for symptoms   HERRON, RN, STACEY A - 02/16/2016 2:36 EST   Pain Assessment   Numeric Rating Pain Scale :   2   Laterality :   Bilateral   Pain Location :   Breast   HERRON, RN, STACEY A - 02/16/2016 2:36 EST   Image 4 -  Images currently included in the form version of this document have not been included in the text rendition version of the form.   POSS Assessment   Pasero Opioid Induced Sedation Scale :   S = Sleep, easy to arouse   Pasero Opioid Induced Sedation Score :   0  (LOW)    HERRON, RN, STACEY A - 02/16/2016 2:36 EST

## 2016-02-16 NOTE — Discharge Summary (Signed)
 Inpatient Clinical Summary             [facility]  Post-Acute Care Transfer Instructions  PERSON INFORMATION   Name: [name] [name], [name] [name]  MRN: [id number]    FIN#: [id number]   PHYSICIANS  Admitting Physician: [address] [place]  Attending Physician: Neena Rhymes   PCP: Gillian Scarce  Discharge Diagnosis:  At high risk for breast cancer  Comment:       PATIENT EDUCATION INFORMATION  Instructions:               Medication Leaflets:               Follow-up:                          With: Address: When:   [address]  [place], [place]  [address]  [phone] 02/18/2016 00:00:00                                 Type Location Start [place]   [place] Tomo Screening Mammogram [facility] Mammo 02/24/2016 14:50:00 02/24/2016 15:00:00 Confirmed          MEDICATION LIST  Medication Reconciliation at Discharge:         Medications that have not changed  Other Medications  fluticasone nasal (Flonase 50 mcg/inh nasal spray) 1 Sprays Nasal (into the nose) every day as needed allergic rhinitis/nasal congestion.  Last Dose:____________________  hydrochlorothiazide 25 Milligram Oral (given by mouth) once a day (in the morning).  Last Dose:____________________  loratadine (Claritin 10 mg oral tablet) 1 Tabs Oral (given by mouth) once a day (in the evening) as needed.  Last Dose:____________________  potassium chloride (Klor-Con M10 oral tablet, extended release) 2 Tabs Oral (given by mouth) once a day (in the morning).  Last Dose:____________________         Patient???s Final Home Medication List Upon Discharge:          fluticasone nasal (Flonase 50 mcg/inh nasal spray) 1 Sprays Nasal (into the nose) every day as needed allergic rhinitis/nasal congestion.  hydrochlorothiazide 25 Milligram Oral (given by mouth) once a day (in the morning).  loratadine (Claritin 10 mg oral tablet) 1 Tabs Oral (given by mouth) once a day (in the evening) as needed.  potassium chloride  (Klor-Con M10 oral tablet, extended release) 2 Tabs Oral (given by mouth) once a day (in the morning).         Comment:       ORDERS         Order Name Order Details   Discharge Patient 02/16/16 11:30:00 EST, Discharge Home/Self Care, Dr. Clide Cliff will be stopping by between 11 and 11:30 to see her before she goes home.

## 2016-03-13 HISTORY — PX: BREAST SURGERY: SHX581

## 2016-05-15 NOTE — Op Note (Signed)
Operative Report    [facility] Sherie Don., MD  Service Date: 05/15/2016    PREOPERATIVE DIAGNOSIS:  Bilateral mastectomy wounds.      POSTOPERATIVE DIAGNOSIS:  Bilateral mastectomy wounds.      PROCEDURE:  Placement ADM wrapped tissue expanders and intraoperative  fluorescent laser angiography.      DESCRIPTION OF PROCEDURE:  After Dr. Edyth Gunnels had completed the  mastectomies the intraoperative fluorescent laser angiogram was  brought into the field.  An angiogram was obtained.  This showed  prompt perfusion of the right breast skin and nipple.  The left breast  skin and nipple showed somewhat less perfusion, particularly in the  region of the nipple-areolar complex, evaluation of absolute dye  density values suggested that the nipple was within acceptable range,  however so, this space coupled with clinical evaluation suggested that  we should proceed, the wound was carefully checked for hemostasis but  the patient's blood pressure elevated above preoperative levels,  Artoura 375 mL tissue expanders were inflated to their nominal volume  with air, they were then covered with perforated AlloDerm with a  peripheral running seam of 3-0 PDS suture.  The implants were then  deflated completely.  They were then placed within the breast wounds  and sutured to the chest wall fascia using 2-0 Vicryl suture.  Two  closed suction drains were placed within each wound and brought out  through separate stab incisions of the inferolateral aspect of the  breast mound where they were sutured into position using 2-0 nylon  suture.  The subcutaneous tissues were closed using 3-0 Vicryl suture.   The skin was closed using running 4-0 subcuticular Monocryl suture  followed by Dermabond.  The patient was then awakened and taken to  recovery in good condition.      Please note assistant was Nunzio Cobbs, RN, first assist.      Nicanor Bake., MD  TR: *n DD: 05/15/2016 13:10 TD: 05/15/2016 14:10 Job#:  [id number]  \\[id number]\\DOC#: [id number]  \\[id number]\\  Signature Line    Electronically Signed on 05/17/2016 11:26 AM EST  ________________________________________________  [facility]

## 2016-05-15 NOTE — Nursing Note (Signed)
Medication Administration Follow Up-Text       Medication Administration Follow Up Entered On:  05/15/2016 17:07 EST    Performed On:  05/15/2016 15:48 EST by Marin Olp, RN, Donnelly Stager      Intervention Information:     diazepam  Performed by Marin Olp, RN, Donnelly Stager on 05/15/2016 15:33:00 EST       diazepam,2.5mg   IV Push,Wrist, Left,severe pain (8-10)       Medication Effectiveness Evaluation   Medication Administration Reason :   Pain   Medication Effective :   Yes   ED Medication Response :   Symptoms improved   Gumber, RN, Donnelly Stager - 05/15/2016 17:07 EST

## 2016-05-15 NOTE — Nursing Note (Signed)
Medication Administration Follow Up-Text       Medication Administration Follow Up Entered On:  05/15/2016 18:22 EST    Performed On:  05/15/2016 16:34 EST by Marin Olp, RN, Donnelly Stager      Intervention Information:     acetaminophen  Performed by Marin Olp, RN, Donnelly Stager on 05/15/2016 15:34:00 EST       acetaminophen,650mg   Oral,mild pain (1-3) or temp > 100.5 F       Medication Effectiveness Evaluation   Medication Administration Reason :   Pain   Medication Effective :   Yes   ED Medication Response :   Symptoms improved   Gumber, RN, Donnelly Stager - 05/15/2016 18:21 EST   Pain Assessment   Numeric Rating Pain Scale :   1   Laterality :   Bilateral   Pain Location :   Breast   Quality :   Aching, Pulling   Gumber, RN, Donnelly Stager - 05/15/2016 18:21 EST   Image 4 -  Images currently included in the form version of this document have not been included in the text rendition version of the form.   Vital Signs   Respiratory Rate :   15 br/min   Gumber, RN, Donnelly Stager - 05/15/2016 18:21 EST   POSS Assessment   Pasero Opioid Induced Sedation Scale :   1 = Awake and alert   Pasero Opioid Induced Sedation Score :   1    Respirations :   Unlabored   Respiratory Pattern :   Regular   Gumber, RN, Donnelly Stager - 05/15/2016 18:21 EST

## 2016-05-15 NOTE — Nursing Note (Signed)
Medication Administration Follow Up-Text       Medication Administration Follow Up Entered On:  05/15/2016 22:06 EST    Performed On:  05/15/2016 20:24 EST by Michaela Corner, RN, Byrd Hesselbach      Intervention Information:     acetaminophen  Performed by Louanna Raw, RN, RACHEL on 05/15/2016 19:24:00 EST       acetaminophen,650mg   Oral,mild pain (1-3) or temp > 100.5 F       Medication Effectiveness Evaluation   Medication Administration Reason :   Pain   Medication Effective :   Yes   ED Medication Response :   No adverse reaction, Symptoms improved   Michaela Corner, RNByrd Hesselbach - 05/15/2016 22:05 EST   Pain Assessment   Numeric Rating Pain Scale :   0 = No pain   Michaela Corner, RN, Byrd Hesselbach - 05/15/2016 22:05 EST   Image 4 -  Images currently included in the form version of this document have not been included in the text rendition version of the form.   Vital Signs   Respiratory Rate :   16 br/min   Michaela Corner, RN, Byrd Hesselbach - 05/15/2016 22:05 EST   POSS Assessment   Pasero Opioid Induced Sedation Scale :   1 = Awake and alert   Pasero Opioid Induced Sedation Score :   1    Respirations :   Unlabored   Respiratory Pattern :   Regular   Michaela Corner, RNByrd Hesselbach - 05/15/2016 22:05 EST

## 2016-05-15 NOTE — Nursing Note (Signed)
Medication Administration Follow Up-Text       Medication Administration Follow Up Entered On:  05/15/2016 20:02 EST    Performed On:  05/15/2016 17:00 EST by Marin Olp, RN, Donnelly Stager      Intervention Information:     ondansetron  Performed by Marin Olp, RN, Donnelly Stager on 05/15/2016 16:00:00 EST       ondansetron,4mg   Oral,nausea/vomiting       Medication Effectiveness Evaluation   Medication Administration Reason :   Nausea   Medication Effective :   Yes   ED Medication Response :   Symptoms improved   Gumber, RN, Donnelly Stager - 05/15/2016 20:02 EST

## 2016-05-15 NOTE — Progress Notes (Signed)
 Chaplaincy Note - Text       Chaplaincy Note Entered On:  05/15/2016 16:32 EST    Performed On:  05/15/2016 16:30 EST by Rene Paci L               Chaplaincy Consult   Faith/Denomination :   Non-denominational   Worshipping Community :   [employer]. Pt noted her prior affiliation with a "four square gospel" tradition.   Religious Spiritual Resources :   Other: Family support   Religious Spiritual Practices :   Prayer, Worship services   Religious Family Issues :   Pt's husband at bedside.   Additional Information, Chaplaincy :   Initial pastoral visit to establish pastoral support. Pt asked for prayer for her health and recovery and this was provided at bedside.   Rene Paci L - 05/15/2016 16:30 EST

## 2016-05-15 NOTE — Op Note (Signed)
 Operative Report                 [place]  Paul L. Edyth Gunnels, MD  Service Date: 05/15/2016    PREOPERATIVE DIAGNOSIS:  CHEK2 mutation.      POSTOPERATIVE DIAGNOSIS:  CHEK2 mutation.      PROCEDURE:  Bilateral nipple sparing, skin sparing mastectomies.      SURGEON:  Stacie Acres, MD.      FIRST ASSISTANT:  Adrienne Mocha PA.      ANESTHESIA:  General with paravertebral block.      INDICATIONS:  The patient is a 51 year old white female with strong  family history of breast cancer.  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  Thus, she decided to have  bilateral nipple sparing mastectomies.  As a lead up to this, she had  bilateral breast reduction surgeries through [facility].   She is brought to the operating room at this time for bilateral  nipple-sparing mastectomies with reconstruction by Dr. Clide Cliff with  tissue expanders wrapped in AlloDerm.      DESCRIPTION OF PROCEDURE:  The patient was taken to the operating room  and placed in supine position.  General anesthesia obtained.  Foley  catheter was inserted.  At this point, actually Dr. Lovina Reach in  orthopedics performed an injection of PRP in her left ankle.  Once  this was completed, I went ahead and proceeded with the prepping and  draping of both breasts and axilla.      I started on the left side.  I made a radial ellipse incision at 6  o'clock and then extended it laterally along the inframammary fold  incision.  I made sure to stay within the confines of the old  incisions.  Dissection was carried down through subcutaneous tissue.   Most of the dissection was done sharply with scissors.  When I reached  the area behind the nipple-areolar complex, the ducts were divided.   The specimen was marked at that point.  The rest of the dissection  continued superior to the clavicle, medially to the sternum,  inframammary fold, laterally to latissimus.  The breast was then  removed from the underlying muscle.  Fascia included with  the  specimen.  This was a tedious dissection.  This took an additional 20  minutes.  The left breast weighed 484 grams.  Gross inspection by  pathology failed to reveal anything suspicious for carcinoma.  Wound  was irrigated out.  Moist laps were placed.      Attention directed to the right breast.  The same incision was made at  6 o'clock and extended laterally.  Dissection was carried down through  subcutaneous tissue.  The bulk of the dissection initially was  scissors and then cautery.  The area behind the nipple-areolar complex  minimally dissected with scissors.  Sutures were placed in the breast  oriented.  The rest of the dissection continued superior to the  clavicle, medially to the sternum, inframammary fold, laterally to  latissimus.  The breast was then removed from the underlying muscle.   Fascia included with the specimen.  Specimen was oriented in 3  dimensions.  The right breast weighed 426 grams.  Hemostasis was  achieved with cautery.  Wound was irrigated out.  This side also took  an additional 20 minutes.  Moist laps were placed.  At this point in  time, Dr. Clide Cliff came in to do the reconstruction.  I started with the  SPY procedures.  This was dictated separately.        I was assisted by Adrienne Mocha, PA for the surgery to help with both  exposure, decision making and hemostasis.      Fleeta Emmer, MD  TR: *n DD: 05/15/2016 11:16 TD: 05/15/2016 12:05 Job#: [id number]  \\[id number]\\DOC#: [id number]  \\[id number]\\      cc:  Nicanor Bake., MD    Signature Line     Electronically Signed on 05/22/2016 08:14 AM EDT   ________________________________________________   Rogelia Rohrer               Modified by: Rogelia Rohrer on 05/22/2016 08:14 AM EDT

## 2016-05-15 NOTE — Nursing Note (Signed)
 Adult Patient History Form-Text       Adult Patient History Entered On:  05/15/2016 16:22 EST    Performed On:  05/15/2016 16:19 EST by Marin Olp, RN, Donnelly Stager               General Info   Patient Identified 2018 :   Identification band, Verbal   Patient Identified :   [name] [name]   Information Given By 2018 :   Self   Preferred Mode of Communication :   Verbal   Accompanied By 2018 :   Spouse   In Charge of News (ICON) Name :   Providence Lanius  [phone]   Pregnancy Status :   Patient denies   In Charge of News (ICON) Code :   1806   Has the patient received chemotherapy or biotherapy within the last 48 hours? :   No   Is the patient currently (2-3 days) receiving radiation treatment? :   No   Gumber, RN, Donnelly Stager - 05/15/2016 16:19 EST   Allergies   (As Of: 05/15/2016 16:22:21 EST)   Allergies (Active)   No Known Allergies  Estimated Onset Date:   Unspecified ; Created By:   Claria Dice; Reaction Status:   Active ; Category:   Drug ; Substance:   No Known Allergies ; Type:   Allergy ; Updated By:   Claria Dice; Reviewed Date:   05/15/2016 16:20 EST        Medication History   Medication List   (As Of: 05/15/2016 16:22:21 EST)   Normal Order    ON Q PUMP 0.2% ROPIVACAINE 500 ml, 1 lumen adjustable rate 1,000 mg  :   ON Q PUMP 0.2% ROPIVACAINE 500 ml, 1 lumen adjustable rate 1,000 mg ; Status:   Ordered ; Ordered As Mnemonic:   ON Q Pump 0.2% Ropivacaine 500 ml 1,000 mg ; Simple Display Line:   6 mL/hr, Infiltrate, Stop: 05/19/16 0:32:00 EST ; Ordering Provider:   Estelle Grumbles; Catalog Code:   ropivacaine ; Order Dt/Tm:   05/15/2016 13:15:07 ; Comment:   Adjustable Flow Rate: May increase rate by 2 mL for severe pain every 20 min to max of 52ml/hr.  Decrease rate by 44ml/hr if patient complains of numbness/tingling/weakness of extremity  Decrease rate by 104ml/hr or lower for symptoms above 2 hrs before physical therapy  Increase rate after physical therapy to pre-therapy setting          Lactated Ringers  Injection solution 1,000 mL  :   Lactated Ringers Injection solution 1,000 mL ; Status:   Ordered ; Ordered As Mnemonic:   Lactated Ringers Injection 1,000 mL ; Simple Display Line:   10 mL/hr, IV ; Ordering Provider:   Everardo Beals; Catalog Code:   Lactated Ringers Injection ; Order Dt/Tm:   05/15/2016 07:53:46 ; Comment:   Perioperative use ONLY  For Non Dialysis Patient          Sodium Chloride 0.9%  solution 500 mL  :   Sodium Chloride 0.9%  solution 500 mL ; Status:   Discontinued ; Ordered As Mnemonic:   Sodium Chloride 0.9% 500 mL ; Simple Display Line:   10 mL/hr, IV ; Ordering Provider:   Rogelia Rohrer; Catalog Code:   Sodium Chloride 0.9% ; Order Dt/Tm:   05/15/2016 07:53:47 ; Comment:   Perioperative use ONLY  For Dialysis Patient          ON  Q PUMP 0.2% ROPIVACAINE 500 ml, 1 lumen adjustable rate 1,000 mg  :   ON Q PUMP 0.2% ROPIVACAINE 500 ml, 1 lumen adjustable rate 1,000 mg ; Status:   Ordered ; Ordered As Mnemonic:   ON Q Pump 0.2% Ropivacaine 500 ml 1,000 mg ; Simple Display Line:   6 mL/hr, Infiltrate, Stop: 05/18/16 18:20:00 EST ; Ordering Provider:   Estelle Grumbles; Catalog Code:   ropivacaine ; Order Dt/Tm:   05/15/2016 07:03:27 ; Comment:   Adjustable Flow Rate: May increase rate by 2 mL for severe pain every 20 min to max of 15ml/hr.  Decrease rate by 6ml/hr if patient complains of numbness/tingling/weakness of extremity  Decrease rate by 84ml/hr or lower for symptoms above 2 hrs before physical therapy  Increase rate after physical therapy to pre-therapy setting          Lactated Ringers Injection solution 1,000 mL  :   Lactated Ringers Injection solution 1,000 mL ; Status:   Ordered ; Ordered As Mnemonic:   Lactated Ringers Injection 1,000 mL ; Simple Display Line:   10 mL/hr, IV ; Ordering Provider:   Audry Riles; Catalog Code:   Lactated Ringers Injection ; Order Dt/Tm:   05/15/2016 06:26:16 ; Comment:   Perioperative use ONLY  For Non Dialysis Patient           hydrochlorothiazide 25 mg Tab  :   hydrochlorothiazide 25 mg Tab ; Status:   Ordered ; Ordered As Mnemonic:   hydrochlorothiazide ; Simple Display Line:   25 mg, 1 tabs, Oral, qAM ; Ordering Provider:   Neena Rhymes; Catalog Code:   hydrochlorothiazide ; Order Dt/Tm:   05/15/2016 13:22:41          potassium chloride 20 mEq ER Tab  :   potassium chloride 20 mEq ER Tab ; Status:   Ordered ; Ordered As Mnemonic:   potassium chloride ; Simple Display Line:   20 mEq, 1 tabs, Oral, qAM ; Ordering Provider:   Neena Rhymes; Catalog Code:   potassium chloride ; Order Dt/Tm:   05/15/2016 13:22:52          acetaminophen 325 mg Tab  :   acetaminophen 325 mg Tab ; Status:   Ordered ; Ordered As Mnemonic:   acetaminophen ; Simple Display Line:   650 mg, 2 tabs, Oral, q4hr, PRN: mild pain (1-3) or temp > 100.5 F ; Ordering Provider:   Neena Rhymes; Catalog Code:   acetaminophen ; Order Dt/Tm:   05/15/2016 14:55:39 ; Comment:   Use Oral route first  Max 3g/24hrs  MAX DAILY DOSE OF ACETAMINOPHEN = 3000 MG          acetaminophen 650 mg Rectal Supp  :   acetaminophen 650 mg Rectal Supp ; Status:   Ordered ; Ordered As Mnemonic:   acetaminophen ; Simple Display Line:   650 mg, 1 supp, PR, q4hr, PRN: mild pain (1-3) or temp > 100.5 F ; Ordering Provider:   Neena Rhymes; Catalog Code:   acetaminophen ; Order Dt/Tm:   05/15/2016 14:55:40 ; Comment:   Use Oral route first  Max 3g/24hrs  MAX DAILY DOSE OF ACETAMINOPHEN = 3000 MG          diazepam 5 mg/mL Inj Soln 2 mL  :   diazepam 5 mg/mL Inj Soln 2 mL ; Status:   Ordered ; Ordered As Mnemonic:   Valium ; Simple Display  Line:   2.5 mg, 0.5 mL, IV Push, q6hr, PRN: severe pain (8-10) ; Ordering Provider:   Neena Rhymes; Catalog Code:   diazepam ; Order Dt/Tm:   05/15/2016 14:55:39 ; Comment:   prn breakthrough pain from muscle spasms          docusate sodium 100 mg Cap  :   docusate sodium 100 mg Cap ; Status:   Ordered ; Ordered As  Mnemonic:   docusate ; Simple Display Line:   100 mg, 1 caps, Oral, BID, PRN: constipation ; Ordering Provider:   Neena Rhymes; Catalog Code:   docusate ; Order Dt/Tm:   05/15/2016 14:55:38          doxycycline hyclate 100 mg Cap  :   doxycycline hyclate 100 mg Cap ; Status:   Ordered ; Ordered As Mnemonic:   doxycycline ; Simple Display Line:   100 mg, 1 caps, Oral, BID ; Ordering Provider:   Neena Rhymes; Catalog Code:   doxycycline ; Order Dt/Tm:   05/15/2016 14:55:37          acetaminophen-HYDROcodone 325 mg-5 mg Tab  :   acetaminophen-HYDROcodone 325 mg-5 mg Tab ; Status:   Ordered ; Ordered As Mnemonic:   HYDROcodone-acetaminophen 5 mg-325 mg range dose ; Simple Display Line:   2 tabs, Oral, q6hr, PRN: moderate pain (4-7) ; Ordering Provider:   Neena Rhymes; Catalog Code:   HYDROcodone-acetaminophen ; Order Dt/Tm:   05/15/2016 14:55:40          HYDROmorphone 2 mg Tab  :   HYDROmorphone 2 mg Tab ; Status:   Ordered ; Ordered As Mnemonic:   Dilaudid ; Simple Display Line:   2 mg, 1 tabs, Oral, q4hr, PRN: moderate pain (4-7) ; Ordering Provider:   Neena Rhymes; Catalog Code:   HYDROmorphone ; Order Dt/Tm:   05/15/2016 14:55:37 ; Comment:   THIS MEDICATION IS ASSOCIATED   WITH   AN INCREASED RISK OF FALLS.          ondansetron 4 mg Tab  :   ondansetron 4 mg Tab ; Status:   Ordered ; Ordered As Mnemonic:   Zofran ; Simple Display Line:   4 mg, 1 tabs, Oral, q8hr, PRN: nausea/vomiting ; Ordering Provider:   Neena Rhymes; Catalog Code:   ondansetron ; Order Dt/Tm:   05/15/2016 14:55:37          polyethylene glycol 3350 Oral Powder for Recon  :   polyethylene glycol 3350 Oral Powder for Recon ; Status:   Ordered ; Ordered As Mnemonic:   MiraLax ; Simple Display Line:   17 g, 1 packets, Oral, Daily, PRN: constipation ; Ordering Provider:   Neena Rhymes; Catalog Code:   polyethylene glycol 3350 ; Order Dt/Tm:   05/15/2016 14:55:39 ; Comment:   " DISSOLVE  POWDER IN 8 OUNCES OF   WATER, JUICE, COLA OR TEA."          simethicone 80 mg Chew Tab  :   simethicone 80 mg Chew Tab ; Status:   Ordered ; Ordered As Mnemonic:   simethicone ; Simple Display Line:   80 mg, 1 tabs, Chewed, BID, PRN: gas ; Ordering Provider:   Neena Rhymes; Catalog Code:   simethicone ; Order Dt/Tm:   05/15/2016 14:55:38          A Patient Specific Medication  :   A Patient Specific  Medication ; Status:   Ordered ; Ordered As Mnemonic:   A Patient Specific Medication ; Simple Display Line:   1 EA, Kit-Combo, q25min, PRN: other (see comment) ; Ordering Provider:   Marcine Matar; Catalog Code:   A Patient Specific Medication ; Order Dt/Tm:   05/15/2016 14:21:22 ; Comment:   to access the patient specific medication drawer          A Patient Specific Refrigerated Medication  :   A Patient Specific Refrigerated Medication ; Status:   Ordered ; Ordered As Mnemonic:   A Patient Specific Refrigerated Medication ; Simple Display Line:   1 EA, Kit-Combo, q37min, PRN: other (see comment) ; Ordering Provider:   Marcine Matar; Catalog Code:   A Patient Specific Refrigerated Medicati ; Order Dt/Tm:   05/15/2016 14:21:22 ; Comment:   to access the patient specific Refrigerated medications          Delivery and Return [place] Access  :   Delivery and Return [place] Access ; Status:   Ordered ; Ordered As Mnemonic:   Delivery and Return Bin Access ; Simple Display Line:   1 EA, Kit-Combo, q60min, PRN: other (see comment) ; Ordering Provider:   Marcine Matar; Catalog Code:   Delivery and Return Bin Access ; Order Dt/Tm:   05/15/2016 14:21:23 ; Comment:   This code grants access to the [employer] for the Delivery and Return Bin Access          lidocaine 1% PF Inj Soln 2 mL  :   lidocaine 1% PF Inj Soln 2 mL ; Status:   Ordered ; Ordered As Mnemonic:   lidocaine 1% preservative-free injectable solution ; Simple Display Line:   0.5 mL, ID, q45min, PRN: other (see comment) ; Ordering Provider:    Marcine Matar; Catalog Code:   lidocaine ; Order Dt/Tm:   05/15/2016 14:21:23 ; Comment:   to access lidocaine 1%  2 mL vial for IV start and Life Port access          Respiratory MDI Treatment  :   Respiratory MDI Treatment ; Status:   Ordered ; Ordered As Mnemonic:   Respiratory MDI Treatment ; Simple Display Line:   1 EA, Kit-Combo, q72min, PRN: other (see comment) ; Ordering Provider:   Marcine Matar; Catalog Code:   Respiratory MDI Treatment ; Order Dt/Tm:   05/15/2016 14:21:23          sodium chloride 0.9% Inj Soln 10 mL syringe  :   sodium chloride 0.9% Inj Soln 10 mL syringe ; Status:   Ordered ; Ordered As Mnemonic:   sodium chloride 0.9% flush syringe range dose ; Simple Display Line:   30 mL, IV Push, q95min, PRN: other (see comment) ; Ordering Provider:   Marcine Matar; Catalog Code:   sodium chloride flush ; Order Dt/Tm:   05/15/2016 14:21:24 ; Comment:   for access to sodium chloride 0.9% syringe for INT flush if needed          sodium chloride 0.9% Inj Soln 10 mL vial  :   sodium chloride 0.9% Inj Soln 10 mL vial ; Status:   Ordered ; Ordered As Mnemonic:   sodium chloride 0.9% vial for reconstitution range dose ; Simple Display Line:   30 mL, IV Push, q43min, PRN: other (see comment) ; Ordering Provider:   Marcine Matar; Catalog Code:   sodium chloride flush ; Order Dt/Tm:   05/15/2016 14:21:24 ;  Comment:   for access to sodium chloride 0.9% vial when needed as a diluent for reconstitutable medications          sterile water Inj Soln 10 mL  :   sterile water Inj Soln 10 mL ; Status:   Ordered ; Ordered As Mnemonic:   sterile water for reconstitution ; Simple Display Line:   10 mL, N/A, q11min, PRN: other (see comment) ; Ordering Provider:   Marcine Matar; Catalog Code:   sterile water for reconstitution ; Order Dt/Tm:   05/15/2016 14:21:24 ; Comment:   to access sterile water when needed as a diluent for reconstitutable medications. Not for IV use.          fluticasone 50 mcg/inh Nasal Spray 16  gm  :   fluticasone 50 mcg/inh Nasal Spray 16 gm ; Status:   Ordered ; Ordered As Mnemonic:   Flonase 50 mcg/inh nasal spray ; Simple Display Line:   50 mcg, 1 sprays, Nasal, Daily, PRN: allergic rhinitis/nasal congestion ; Ordering Provider:   Neena Rhymes; Catalog Code:   fluticasone nasal ; Order Dt/Tm:   05/15/2016 13:22:39          HYDROmorphone 2 mg/mL Inj Soln 1 mL  :   HYDROmorphone 2 mg/mL Inj Soln 1 mL ; Status:   Discontinued ; Ordered As Mnemonic:   HYDROmorphone range dose ; Simple Display Line:   0.5 mg, 0.25 mL, IV Push, q64min, PRN: other (see comment) ; Ordering Provider:   Estelle Grumbles; Catalog Code:   HYDROmorphone ; Order Dt/Tm:   05/15/2016 13:14:38 ; Comment:   For Pain  Max dose = 2mg  Primary choice          ondansetron 2 mg/mL Inj Soln 2 mL  :   ondansetron 2 mg/mL Inj Soln 2 mL ; Status:   Discontinued ; Ordered As Mnemonic:   ondansetron ; Simple Display Line:   4 mg, 2 mL, IV Push, Once, PRN: nausea ; Ordering Provider:   Estelle Grumbles; Catalog Code:   ondansetron ; Order Dt/Tm:   05/15/2016 13:14:39 ; Comment:   Primary choice          promethazine 25 mg/mL Inj Soln 1 mL  :   promethazine 25 mg/mL Inj Soln 1 mL ; Status:   Discontinued ; Ordered As Mnemonic:   promethazine IV range dose - High Alert ; Simple Display Line:   12.5 mg, 0.5 mL, IV, Once, PRN: nausea ; Ordering Provider:   Estelle Grumbles; Catalog Code:   promethazine ; Order Dt/Tm:   05/15/2016 13:14:39 ; Comment:   Please dilute promethazine 25mg / 1 mL with 9 mL of normal saline for a total of 10 mL  (final concentration equals 2.5 mg / 1 mL) and push dose over 1 minute.          bacitracin 100,000 units + gentamicin 160 mg + ceFAZolin 2 g + Sodium Chloride 0.9% intravenous sol...  :   bacitracin 100,000 units + gentamicin 160 mg + ceFAZolin 2 g + Sodium Chloride 0.9% intravenous sol... ; Status:   Ordered ; Ordered As Mnemonic:   bacitracin 100,000 units + gentamicin 160 mg +  ceFAZolin 2 g + Sodium Chloride 0.9% 1,000 mL ; Simple Display Line:   160 mg, 4 mL, 0 mL/hr, IRR, Once ; Ordering Provider:   Rogelia Rohrer; Catalog Code:   gentamicin ; Order Dt/Tm:   05/15/2016 12:02:46 ; Comment:  ADAMS solution is for Irrigation only  intra -op use only          bacitracin 100,000 units + gentamicin 160 mg + ceFAZolin 2 g + Sodium Chloride 0.9% intravenous sol...  :   bacitracin 100,000 units + gentamicin 160 mg + ceFAZolin 2 g + Sodium Chloride 0.9% intravenous sol... ; Status:   Ordered ; Ordered As Mnemonic:   bacitracin 100,000 units + gentamicin 160 mg + ceFAZolin 2 g + Sodium Chloride 0.9% 1,000 mL ; Simple Display Line:   160 mg, 4 mL, 0 mL/hr, IRR, Once ; Ordering Provider:   Rogelia Rohrer; Catalog Code:   gentamicin ; Order Dt/Tm:   05/15/2016 12:02:17 ; Comment:   ADAMS solution is for Irrigation only  intra -op use only          ceFAZolin duplex  :   ceFAZolin duplex ; Status:   Completed ; Ordered As Mnemonic:   ceFAZolin IVPB ; Simple Display Line:   2 g, 50 mL, 100 mL/hr, IV Piggyback, On Call ; Ordering Provider:   Everardo Beals; Catalog Code:   ceFAZolin ; Order Dt/Tm:   05/15/2016 07:53:47          acetaminophen 500 mg Tab  :   acetaminophen 500 mg Tab ; Status:   Completed ; Ordered As Mnemonic:   acetaminophen ; Simple Display Line:   1,000 mg, 2 tabs, Oral, Once ; Ordering Provider:   Estelle Grumbles; Catalog Code:   acetaminophen ; Order Dt/Tm:   05/15/2016 07:19:57 ; Comment:   Pediatric Administration  MAX DAILY DOSE OF ACETAMINOPHEN = 3000 MG  Target Dose: acetaminophen 10 mg/kg  04/28/2016 10:49:25          diazepam 5 mg Tab  :   diazepam 5 mg Tab ; Status:   Completed ; Ordered As Mnemonic:   Valium ; Simple Display Line:   10 mg, 2 tabs, Oral, PRE-OP ; Ordering Provider:   Estelle Grumbles; Catalog Code:   diazepam ; Order Dt/Tm:   05/15/2016 07:19:57          bacitracin 100,000 units + gentamicin 160 mg + ceFAZolin 2 g + Sodium Chloride 0.9%  intravenous sol...  :   bacitracin 100,000 units + gentamicin 160 mg + ceFAZolin 2 g + Sodium Chloride 0.9% intravenous sol... ; Status:   Ordered ; Ordered As Mnemonic:   bacitracin 100,000 units + gentamicin 160 mg + ceFAZolin 2 g + Sodium Chloride 0.9% 1,000 mL ; Simple Display Line:   160 mg, 4 mL, 0 mL/hr, IRR, Once ; Ordering Provider:   Rogelia Rohrer; Catalog Code:   gentamicin ; Order Dt/Tm:   05/11/2016 07:42:46 ; Comment:   ADAMS solution is for Irrigation only  intra -op use only          DOXYCYCLINE 100MG /250ML D5W ADM  :   DOXYCYCLINE 100MG /250ML D5W ADM ; Status:   Completed ; Ordered As Mnemonic:   doxycycline ; Simple Display Line:   100 mg, 250 mL, 125 mL/hr, IV Piggyback, On Call ; Ordering Provider:   Audry Riles; Catalog Code:   doxycycline ; Order Dt/Tm:   05/15/2016 06:26:17          Lactated Ringers Injection IV Sol 500 mL  :   Lactated Ringers Injection IV Sol 500 mL ; Status:   Ordered ; Ordered As Mnemonic:   Lactated Ringer Bolus ; Simple Display Line:  500 mL, IV Bolus, Once ; Ordering Provider:   Newt Lukes; Catalog Code:   Lactated Ringers Injection ; Order Dt/Tm:   01/28/2015 13:09:38            Home Meds    potassium chloride  :   potassium chloride ; Status:   Documented ; Ordered As Mnemonic:   Klor-Con M10 oral tablet, extended release ; Simple Display Line:   20 mEq, 2 tabs, Oral, qAM ; Catalog Code:   potassium chloride ; Order Dt/Tm:   11/01/2015 07:54:31          hydrochlorothiazide  :   hydrochlorothiazide ; Status:   Documented ; Ordered As Mnemonic:   hydrochlorothiazide ; Simple Display Line:   25 mg, Oral, qAM, 0 Refill(s) ; Catalog Code:   hydrochlorothiazide ; Order Dt/Tm:   08/10/2015 16:32:40          fluticasone nasal  :   fluticasone nasal ; Status:   Documented ; Ordered As Mnemonic:   Flonase 50 mcg/inh nasal spray ; Simple Display Line:   1 sprays, Nasal, Daily, PRN: allergic rhinitis/nasal congestion, 16 g, 0 Refill(s) ; Catalog Code:    fluticasone nasal ; Order Dt/Tm:   01/27/2015 14:55:52          loratadine  :   loratadine ; Status:   Documented ; Ordered As Mnemonic:   Claritin 10 mg oral tablet ; Simple Display Line:   10 mg, 1 tabs, Oral, qPM, PRN, 0 Refill(s) ; Catalog Code:   loratadine ; Order Dt/Tm:   01/27/2015 14:55:36            Problem History   (As Of: 05/15/2016 16:22:21 EST)   Problems(Active)    At high risk for breast cancer (SNOMED CT  :[phone] )  Name of Problem:   At high risk for breast cancer ; Recorder:   LUTZ, RN, CARLA W; Confirmation:   Confirmed ; Classification:   Patient Stated ; Code:   [id number] ; Contributor System:   Dietitian ; Last Updated:   02/11/2016 9:25 EST ; Life Cycle Date:   02/11/2016 ; Life Cycle Status:   Active ; Vocabulary:   SNOMED CT        Cyst of kidney (SNOMED CT  :[id number] )  Name of Problem:   Cyst of kidney ; Recorder:   Alfonse Ras RN, Nicholos Johns; Confirmation:   Confirmed ; Classification:   Patient Stated ; Code:   [id number] ; Contributor System:   Dietitian ; Last Updated:   11/01/2015 7:57 EDT ; Life Cycle Date:   11/01/2015 ; Life Cycle Status:   Active ; Responsible Provider:   Elaina Hoops; Vocabulary:   SNOMED CT   ; Comments:        11/01/2015 7:57 - Alfonse Ras, RN, Nicholos Johns  RIGHT--SEEN ON CT--BEING FOLLOWED      Family history of cancer of colon (SNOMED CT  :[id number] )  Name of Problem:   Family history of cancer of colon ; Recorder:   Ham, RN, Nicholos Johns; Confirmation:   Confirmed ; Classification:   Patient Stated ; Code:   [id number] ; Contributor System:   Dietitian ; Last Updated:   11/01/2015 7:55 EDT ; Life Cycle Date:   11/01/2015 ; Life Cycle Status:   Active ; Vocabulary:   SNOMED CT        History of basal cell carcinoma (SNOMED CT  [phone] )  Name of Problem:   History of basal cell  carcinoma ; Recorder:   STOKES, RN, REBECCA H; Confirmation:   Confirmed ; Classification:   Patient Stated ; Code:   [id number] ; Contributor System:   Dietitian ;  Last Updated:   05/11/2016 13:11 EST ; Life Cycle Date:   05/11/2016 ; Life Cycle Status:   Active ; Vocabulary:   SNOMED CT        Hypertension (SNOMED CT  :09811914 )  Name of Problem:   Hypertension ; Recorder:   CARSON, RN, MELISSA; Confirmation:   Confirmed ; Classification:   Medical ; Code:   [id number] ; Contributor System:   PowerChart ; Last Updated:   08/10/2015 16:31 EDT ; Life Cycle Date:   08/10/2015 ; Life Cycle Status:   Active ; Vocabulary:   SNOMED CT   ; Comments:        11/01/2015 7:55 - Alfonse Ras, RN, Nicholos Johns  CONTROLLED      Impaired tissue integrity (SNOMED CT  :30865784 )  Name of Problem:   Impaired tissue integrity ; Recorder:   SYSTEM,  SYSTEM; Confirmation:   Confirmed ; Classification:   Interdisciplinary ; Code:   [id number] ; Last Updated:   02/15/2016 23:10 EST ; Life Cycle Date:   02/15/2016 ; Life Cycle Status:   Active ; Vocabulary:   SNOMED CT   ; Comments:        02/15/2016 23:10 - SYSTEM,  SYSTEM  Problem added automatically by system based on initiation of Impaired Tissue Integrity Plan of Care      Post-operative nausea and vomiting (SNOMED CT  :4132440 )  Name of Problem:   Post-operative nausea and vomiting ; Recorder:   Kennieth Rad; Confirmation:   Confirmed ; Classification:   Patient Stated ; Code:   [id number] ; Contributor System:   PowerChart ; Last Updated:   01/27/2015 14:57 EST ; Life Cycle Date:   01/27/2015 ; Life Cycle Status:   Active ; Vocabulary:   SNOMED CT        Seasonal allergies (SNOMED CT  :[phone] )  Name of Problem:   Seasonal allergies ; Recorder:   Kennieth Rad; Confirmation:   Confirmed ; Classification:   Patient Stated ; Code:   [id number] ; Contributor System:   PowerChart ; Last Updated:   01/27/2015 14:57 EST ; Life Cycle Date:   01/27/2015 ; Life Cycle Status:   Active ; Vocabulary:   SNOMED CT          Diagnoses(Active)    Achilles tendinitis  Date:   05/14/2016 ; Diagnosis Type:   Discharge ; Confirmation:    Confirmed ; Clinical Dx:   Achilles tendinitis ; Classification:   Medical ; Clinical Service:   Non-Specified ; Code:   ICD-10-CM ; Probability:   0 ; Diagnosis Code:   M76.60      Achilles tendinitis  Date:   05/15/2016 ; Confirmation:   Confirmed ; Clinical Dx:   Achilles tendinitis ; Classification:   Medical ; Clinical Service:   Non-Specified ; Code:   ICD-10-CM ; Probability:   0 ; Diagnosis Code:   M76.60      Encounter for therapeutic drug level monitoring  Date:   05/15/2016 ; Confirmation:   Confirmed ; Clinical Dx:   Encounter for therapeutic drug level monitoring ; Classification:   Medical ; Clinical Service:   Non-Specified ; Code:   ICD-10-CM ; Probability:   0 ; Diagnosis Code:   Z51.81  Procedure History        -    Procedure History   (As Of: 05/15/2016 16:22:21 EST)     Anesthesia Minutes:   0 ; Procedure Name:   Excision of cyst of breast ; Procedure Minutes:   0 ; Comments:     11/01/2015 07:58 - Alfonse Ras, RN, Nicholos Johns  RIGHT ; Last Reviewed Dt/Tm:   05/15/2016 16:20:18 EST            Procedure Dt/Tm:   01/28/2015 12:33:00 EST ; Location:   [facility] ; Provider:   Josiah Lobo A.-MD; Anesthesia Type:   Monitored Anesthesia Care ; :   Starleen Arms T-MD; Anesthesia Minutes:   0 ; Procedure Name:   Wrist Ganglion Cyst Excision ; Procedure Minutes:   29 ; Comments:     01/28/2015 13:11 - LIVINGSTON, RN, DOROTHY  auto-populated from documented surgical case ; Clinical Service:   Surgery ; Last Reviewed Dt/Tm:   05/15/2016 16:20:18 EST            Anesthesia Minutes:   0 ; Procedure Name:   Cyst of eyelid of left eye ; Procedure Minutes:   0 ; Last Reviewed Dt/Tm:   05/15/2016 16:20:18 EST            Anesthesia Minutes:   0 ; Procedure Name:   Sling procedure of bladder neck ; Procedure Minutes:   0 ; Last Reviewed Dt/Tm:   05/15/2016 16:20:18 EST            Anesthesia Minutes:   0 ; Procedure Name:   Arthroscopy of knee ; Procedure Minutes:   0 ; Comments:     01/27/2015 15:00 - Kennieth Rad  RIGHT ; Last  Reviewed Dt/Tm:   05/15/2016 16:20:18 EST            Anesthesia Minutes:   0 ; Procedure Name:   Hysterectomy ; Procedure Minutes:   0 ; Comments:     05/11/2016 12:58 - STOKES, RN, REBECCA H  TAH ; Last Reviewed Dt/Tm:   05/15/2016 16:20:18 EST            Procedure Dt/Tm:   11/01/2015 08:43:00 EDT ; Location:   [facility] ; Provider:   Ronnell Freshwater D; Anesthesia Type:   Monitored Anesthesia Care ; :   [place]iana Shore; Anesthesia Minutes:   0 ; Procedure Name:   Colonoscopy with Polyp Cold ; Procedure Minutes:   15 ; Comments:     11/01/2015 08:59 - PRICE,  MICHELLE A-RN  auto-populated from documented surgical case ; Clinical Service:   Surgery ; Last Reviewed Dt/Tm:   05/15/2016 16:20:18 EST            Procedure Dt/Tm:   02/15/2016 14:09:00 EST ; Location:   [facility] ; Provider:   Neena Rhymes; Anesthesia Type:   General ; :   PHELPS-MD,  SARAH; Anesthesia Minutes:   0 ; Procedure Name:   Breast Reduction (Bilateral) ; Procedure Minutes:   165 ; Comments:     02/15/2016 17:03 - Louis Matte H  auto-populated from documented surgical case ; Clinical Service:   Surgery ; Last Reviewed Dt/Tm:   05/15/2016 16:20:18 EST            Procedure Dt/Tm:   05/15/2016 08:35:00 EST ; Location:   [facility] ; Provider:   Rogelia Rohrer; Anesthesia Type:   General ; :   BARTLETT-MD,  [facility]	[place]; Anesthesia Minutes:   0 ; Procedure Name:   Mastectomy Bilateral (Bilateral) ; Procedure Minutes:   113 ; Comments:     05/15/2016 13:06 - Jess Barters, RN, Corinna Capra  auto-populated from documented surgical case ; Clinical Service:   Surgery ; Last Reviewed Dt/Tm:   05/15/2016 16:20:18 EST            Procedure Dt/Tm:   05/15/2016 08:35:00 EST ; Location:   [facility] ; Provider:   Marcine Matar; Anesthesia Type:   General ; :   BARTLETT-MD,  Andres Ege; Anesthesia Minutes:   0 ; Procedure Name:   Autologous Injection (Left) ; Procedure Minutes:   5 ; Comments:     05/15/2016 13:06 - Jess Barters, RN, Corinna Capra  auto-populated from documented  surgical case ; Clinical Service:   Surgery ; Last Reviewed Dt/Tm:   05/15/2016 16:20:18 EST            Procedure Dt/Tm:   05/15/2016 08:35:00 EST ; Location:   [facility] ; Provider:   Neena Rhymes; Anesthesia Type:   General ; :   Estelle Grumbles; Anesthesia Minutes:   0 ; Procedure Name:   Breast Reconstruction with Implant or Tissue Expander (Bilateral) ; Procedure Minutes:   126 ; Comments:     05/15/2016 13:06 - Jess Barters, RN, Corinna Capra  auto-populated from documented surgical case ; Clinical Service:   Surgery ; Last Reviewed Dt/Tm:   05/15/2016 16:20:18 EST            Immunizations   Influenza Vaccine Status :   Received prior to admission, during current flu season   Last Tetanus :   Unknown   [place], RN, Donnelly Stager - 05/15/2016 16:19 EST   ID Risk Screen   Chills :   No   Cough (Any Duration) :   No   Fever :   No   Hemoptysis (Blood in Sputum) :   No   Night Sweats :   No   Weight Loss Greater Than 10 Pounds :   No   Hx of TB Now or at Any Time In the Past (Even if on Meds) :   No   Foreign-Born :   No   Homeless or In Shelter :   No   Incarcerated Within Last 2 Years :   No   Intravenous Drug User :   No   Female Homosexual :   No   New TST/IGRA Results Pos(Within 2 yrs),Hx Recent TB Exposure :   No   Gumber, RN, Donnelly Stager - 05/15/2016 16:19 EST   3 or more loose/watery stools :   No   MRSA/VRE Screening :   None of these apply   Patient Recent Travel History :   No recent travel   Family Member Travel History :   No recent travel   [place], Charity fundraiser, Donnelly Stager - 05/15/2016 16:19 EST   Infectious Disease Risk Factor Grid   Chills :   No   Fever :   No   Fatigue :   No   Headache :   No   Runny or Stuffy Nose :   No   Sore Throat :   No   Difficulty Breathing :   No   Shortness of Breath :   No   New or Worsening Cough :   No   Wheezing :   No   Vomiting :  No   Diarrhea :   No   Abdominal (Stomach Pain) :   No   Muscle Pain :   No   Weakness/Numbness :   No   Recent Exposure to Communicable Disease :   No    Illness With Generalized Rash :   No   Abnormal Bleeding :   No   Unexplained Hemorrhage (Bleeding or Bruising) :   No   Arthralgia :   No   Conjunctivitis :   No   Marin Olp, RN, Donnelly Stager - 05/15/2016 16:19 EST   Bloodless Medicine   Will Patient Accept Blood Transfusion and/or Blood Products :   Yes   Gumber, RN, Donnelly Stager - 05/15/2016 16:19 EST   Nutrition   Nutritional Risk Factors :   None   Unintentional Weight Change Greater Than 10 lbs in the Last 6 Months :   No   Gumber, RN, Donnelly Stager - 05/15/2016 16:19 EST   Functional   Sensory Deficits :   None   How are you functioning at home prior to this admission? :   Minimal assist   Gumber, RN, Donnelly Stager - 05/15/2016 16:19 EST   Social History   Social History   (As Of: 05/15/2016 16:22:21 EST)   Tobacco:        Never smoker   (Last Updated: 02/15/2016 18:27:03 EST by Maisie Fus, RN, BRITNEY O)          Alcohol:        Current   Comments:  05/11/2016 13:00 - [place], RN, REBECCA H: 6/MONTH   (Last Updated: 05/11/2016 13:00:21 EST by Tristan Schroeder, RN, REBECCA H)          Substance Abuse:        Denies   (Last Updated: 01/27/2015 15:03:17 EST by Kennieth Rad)            Harm Screen   Injuries/Abuse/Neglect in Household :   Denies   Feels Unsafe at Home :   No   Marin Olp, RN, Donnelly Stager - 05/15/2016 16:19 EST   Advance Directive   Advance Directive :   No   Marin Olp RN, Donnelly Stager - 05/15/2016 16:19 EST   Education   Caregiver/Advocate Language   Patient :   None   Family :   None   Marin Olp, RN, Donnelly Stager - 05/15/2016 16:19 EST   Barriers to Learning :   None evident   Gumber, RN, Nolberto Hanlon E - 05/15/2016 16:19 EST   DCP GENERIC CODE   Unit/Room Orientation :   Verbalizes understanding   Environmental Safety :   Verbalizes understanding   Hand Washing :   Verbalizes understanding   Infection Prevention :   Verbalizes understanding   DVT Prophylaxis :   Verbalizes understanding   Isolation Precaution :   Verbalizes understanding   Gumber, RN, Donnelly Stager - 05/15/2016 16:19 EST   DC Needs   Living Situation :   Home with  family support   Home Caregiver Name/Relationship :   Aven [place] Husband [phone]   Anticipated Discharge Needs :   None   Gumber, RN, Donnelly Stager - 05/15/2016 16:19 EST   Valuables and Belongings   Does Patient Have Valuables and Belongings :   Yes   Gumber, RN, Donnelly Stager - 05/15/2016 16:19 EST   DCP GENERIC CODE   At Bedside :   Clothes   Gumber, RN, Donnelly Stager - 05/15/2016 16:19 EST   Admission Complete  Admission Complete :   Yes   Gumber, RN, Donnelly Stager - 05/15/2016 16:19 EST

## 2016-05-16 NOTE — Nursing Note (Signed)
Medication Administration Follow Up-Text       Medication Administration Follow Up Entered On:  05/16/2016 6:42 EST    Performed On:  05/16/2016 6:14 EST by Michaela Corner, RN, Byrd Hesselbach      Intervention Information:     acetaminophen  Performed by Michaela Corner, RN, Maria on 05/16/2016 05:14:00 EST       acetaminophen,650mg   Oral,mild pain (1-3) or temp > 100.5 F       Medication Effectiveness Evaluation   Medication Administration Reason :   Pain   Medication Effective :   Yes   ED Medication Response :   No adverse reaction, Symptoms improved   Michaela Corner RNByrd Hesselbach - 05/16/2016 6:41 EST   Pain Assessment   Numeric Rating Pain Scale :   2   Laterality :   Bilateral   Quality :   Discomfort, Incisional   Michaela Corner, RN, Byrd Hesselbach - 05/16/2016 6:41 EST   Image 4 -  Images currently included in the form version of this document have not been included in the text rendition version of the form.   Vital Signs   Respiratory Rate :   20 br/min   O2 Therapy :   Room air   Blane Ohara - 05/16/2016 6:41 EST   POSS Assessment   Pasero Opioid Induced Sedation Scale :   S = Sleep, easy to arouse   Pasero Opioid Induced Sedation Score :   0  (LOW)    Respirations :   Unlabored   Respiratory Pattern :   Regular   Michaela Corner RNByrd Hesselbach - 05/16/2016 6:41 EST

## 2016-05-16 NOTE — Nursing Note (Signed)
Medication Administration Follow Up-Text       Medication Administration Follow Up Entered On:  05/16/2016 6:43 EST    Performed On:  05/16/2016 6:14 EST by Michaela Corner, RN, Byrd Hesselbach      Intervention Information:     diazepam  Performed by Michaela Corner, RN, Byrd Hesselbach on 05/16/2016 05:14:00 EST       diazepam,5mg   Oral,muscle pain       Medication Effectiveness Evaluation   Medication Administration Reason :   Pain   Medication Effective :   Yes   ED Medication Response :   No adverse reaction, Symptoms improved   Michaela Corner RNByrd Hesselbach - 05/16/2016 6:42 EST   Pain Assessment   Numeric Rating Pain Scale :   2   Laterality :   Bilateral   Pain Location :   Back   Quality :   Discomfort   Michaela Corner, RN, Byrd Hesselbach - 05/16/2016 6:42 EST   Image 4 -  Images currently included in the form version of this document have not been included in the text rendition version of the form.   Vital Signs   Respiratory Rate :   16 br/min   O2 Therapy :   Room air   [place], RN, Byrd Hesselbach - 05/16/2016 6:42 EST   POSS Assessment   Pasero Opioid Induced Sedation Scale :   S = Sleep, easy to arouse   Pasero Opioid Induced Sedation Score :   0  (LOW)    Respirations :   Unlabored   Respiratory Pattern :   Regular   Michaela Corner, RNByrd Hesselbach - 05/16/2016 6:42 EST

## 2016-05-16 NOTE — Nursing Note (Signed)
Medication Administration Follow Up-Text       Medication Administration Follow Up Entered On:  05/16/2016 2:29 EST    Performed On:  05/16/2016 1:11 EST by Michaela Corner, RN, Byrd Hesselbach      Intervention Information:     acetaminophen  Performed by Michaela Corner, RN, Maria on 05/16/2016 00:11:00 EST       acetaminophen,650mg   Oral,mild pain (1-3) or temp > 100.5 F       Pain Assessment   Numeric Rating Pain Scale :   0 = No pain   Michaela Corner, RN, Byrd Hesselbach - 05/16/2016 2:28 EST   Image 4 -  Images currently included in the form version of this document have not been included in the text rendition version of the form.   Vital Signs   Respiratory Rate :   16 br/min   O2 Therapy :   Room air   [place], RN, Byrd Hesselbach - 05/16/2016 2:28 EST   POSS Assessment   Pasero Opioid Induced Sedation Scale :   S = Sleep, easy to arouse   Pasero Opioid Induced Sedation Score :   0  (LOW)    Respirations :   Unlabored   Respiratory Pattern :   Regular   Michaela Corner, RNByrd Hesselbach - 05/16/2016 2:28 EST

## 2016-05-16 NOTE — Nursing Note (Signed)
Medication Administration Follow Up-Text       Medication Administration Follow Up Entered On:  05/16/2016 0:15 EST    Performed On:  05/15/2016 23:13 EST by Michaela Corner, RN, Byrd Hesselbach      Intervention Information:     diazepam  Performed by Michaela Corner, RN, Maria on 05/15/2016 22:58:00 EST       diazepam,2.5mg   IV Push,Wrist, Left,severe pain (8-10)       Medication Effectiveness Evaluation   Medication Administration Reason :   Pain   Medication Effective :   Yes   ED Medication Response :   No adverse reaction, Symptoms improved   Michaela Corner, RN, Byrd Hesselbach - 05/16/2016 0:14 EST   Pain Assessment   Numeric Rating Pain Scale :   0 = No pain   Michaela Corner, RN, Byrd Hesselbach - 05/16/2016 0:14 EST   Image 4 -  Images currently included in the form version of this document have not been included in the text rendition version of the form.   Vital Signs   Respiratory Rate :   18 br/min   O2 Therapy :   Room air   [place], RN, Byrd Hesselbach - 05/16/2016 0:14 EST   POSS Assessment   Pasero Opioid Induced Sedation Scale :   S = Sleep, easy to arouse   Pasero Opioid Induced Sedation Score :   0  (LOW)    Respirations :   Unlabored   Respiratory Pattern :   Regular   Michaela Corner, RNByrd Hesselbach - 05/16/2016 0:14 EST

## 2016-05-16 NOTE — Nursing Note (Signed)
Medication Administration Follow Up-Text       Medication Administration Follow Up Entered On:  05/16/2016 2:29 EST    Performed On:  05/16/2016 1:11 EST by Michaela Corner, RN, Byrd Hesselbach      Intervention Information:     ondansetron  Performed by Michaela Corner, RN, Byrd Hesselbach on 05/16/2016 00:11:00 EST       ondansetron,4mg   Oral,nausea/vomiting       Medication Effectiveness Evaluation   Medication Administration Reason :   Nausea   Medication Effective :   Yes   ED Medication Response :   No adverse reaction, Symptoms improved   Michaela Corner, RN, Byrd Hesselbach - 05/16/2016 2:29 EST   POSS Assessment   Pasero Opioid Induced Sedation Scale :   S = Sleep, easy to arouse   Pasero Opioid Induced Sedation Score :   0  (LOW)    Respirations :   Unlabored   Respiratory Pattern :   Regular   Michaela Corner, RNByrd Hesselbach - 05/16/2016 2:29 EST

## 2016-05-16 NOTE — Discharge Summary (Signed)
 Inpatient Clinical Summary             [facility]  Post-Acute Care Transfer Instructions  PERSON INFORMATION   Name: [name] [name], [name] [name]  MRN: [id number]    FIN#: [id number]   PHYSICIANS  Admitting Physician: Rogelia Rohrer  Attending Physician: Rogelia Rohrer   PCP: Gillian Scarce  Discharge Diagnosis:  Achilles tendinitis  Comment:       PATIENT EDUCATION INFORMATION  Instructions:             Doxycycline tablets or capsules; Diazepam tablets  Medication Leaflets:               Follow-up:                          With: Address: When:   [address]  [place], [place]  [address]  [phone] 05/17/2016 00:00:00                           MEDICATION LIST  Medication Reconciliation at Discharge:         New Medications  Other Medications  diazepam (diazepam 5 mg oral tablet) 1 Tabs Oral (given by mouth) every 6 hours as needed muscle pain.  Last Dose:____________________  doxycycline (doxycycline hyclate 100 mg oral capsule) 1 Capsules Oral (given by mouth) 2 times a day.  Last Dose:____________________  Medications that have not changed  Other Medications  fluticasone nasal (Flonase 50 mcg/inh nasal spray) 1 Sprays Nasal (into the nose) every day as needed allergic rhinitis/nasal congestion.  Last Dose:____________________  hydrochlorothiazide 25 Milligram Oral (given by mouth) once a day (in the morning).  Last Dose:____________________  loratadine (Claritin 10 mg oral tablet) 1 Tabs Oral (given by mouth) once a day (in the evening) as needed.  Last Dose:____________________  potassium chloride (Klor-Con M10 oral tablet, extended release) 2 Tabs Oral (given by mouth) once a day (in the morning).  Last Dose:____________________         Patient???s Final Home Medication List Upon Discharge:          diazepam (diazepam 5 mg oral tablet) 1 Tabs Oral (given by mouth) every 6 hours as needed muscle pain.  doxycycline (doxycycline hyclate 100 mg oral capsule) 1  Capsules Oral (given by mouth) 2 times a day.  fluticasone nasal (Flonase 50 mcg/inh nasal spray) 1 Sprays Nasal (into the nose) every day as needed allergic rhinitis/nasal congestion.  hydrochlorothiazide 25 Milligram Oral (given by mouth) once a day (in the morning).  loratadine (Claritin 10 mg oral tablet) 1 Tabs Oral (given by mouth) once a day (in the evening) as needed.  potassium chloride (Klor-Con M10 oral tablet, extended release) 2 Tabs Oral (given by mouth) once a day (in the morning).         Comment:       ORDERS         Order Name Order Details   Discharge Patient 05/16/16 7:36:00 EST

## 2016-05-16 NOTE — Op Note (Signed)
 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      PROCEDURE PERFORMED:  Platelet-rich plasma injection to the left heel.      SURGEON:  Trudie Buckler, MD      ASSISTANT:  None      OPERATIVE COMPLICATIONS:  No complications encountered.      POSTOPERATIVE DISPOSITION:  Stable.      INDICATIONS FOR PROCEDURE:  The patient has a history of insertional  calcific tendinosis.  She is currently undergoing _____, and unable to  undergo any traditional surgery on the Achilles at this time, but  continues to have significant pain, limited walking tolerance, and now  presents for a means of hopefully obtaining some sustainable relief  for the left heel.  We talked about the risks, including incomplete  relief of pain, results not guaranteed, infection, and need for  additional treatment.  All questions were answered.  She wished to  proceed.      OPERATIVE DETAILS:  _____ anesthesia, 15 mL of blood was withdrawn  from the antecubital fossa and centrifuged for 5 minutes, yielding 7  mL of platelet-rich plasma.  Using a _____-gauge needle, the injection  was performed directly into the insertion of the Achilles at the point  of maximal tenderness as designated by the patient preoperatively.   The foot was then dressed with a Band-Aid.  She tolerated the  procedure well and no complications were encountered.      Trudie Buckler, MD  TR: *n DD: 05/20/2016 16:06 TD: 05/20/2016 20:07 Job#: [id number]  \\[id number]\\DOC#: [id number]  \\[id number]\\  Signature Line    Electronically Signed on 05/21/2016 04:42 PM EDT  ________________________________________________  Marcine Matar

## 2016-05-16 NOTE — Procedures (Signed)
 IntraOp Record - [facility]             IntraOp Record - [facility] Summary                                                                   Primary Physician:        Rogelia Rohrer    Case Number:              [id number]    Finalized Date/Time:      05/16/16 09:37:49    Pt. Name:                 [name] [name], [name] [name]    D.O.B./Sex:               Oct 08, 1965    Female    Med Rec #:                [id number]    Physician:                Rogelia Rohrer    Financial #:              [id number]    Pt. Type:                 R    Room/Bed:                 3119/01    Admit/Disch:              05/15/16 06:05:00 -    Institution:       [facility] - Case Times                                                                                                         Entry 1                                                                                                          Patient      In Room Time             05/15/16 08:16:00               Out Room Time                   05/15/16 13:05:00    Anesthesia     Procedure  Start Time               05/15/16 08:35:00               Stop Time                       05/15/16 13:03:00    Last Modified By:         Sherie Don                              05/15/16 13:04:56      [facility] - Case Times Audit                                                                          05/15/16 13:04:56         Owner: [id number]                               Modifier: [id number]                                                        <+> 1         Out Room Time        <+> 1         Stop Time        [facility] - Safety Checklist - Sign In                                                               Pre-Care Text:            A.10 Confirms patient identity A.20 Verifies operative procedure, surgical site, and laterality A.20.1 Verifies           consent for planned procedure A.30 Verifies allergies                              Entry 1                                                                                                           History/Physical on       Yes  Procedure Consent               Yes    Chart                                                     on Chart     Site Marked (if           Yes    applicable)     Last Modified By:         Sherie Don                              05/15/16 08:48:55    Post-Care Text:            E.30 Evaluates verification process for correct patient, site, side, and level surgery      [facility] - Case Attendance                                                                                                    Entry 1                         Entry 2                         Entry 3                                          Case Attendee             BARTLETT-MD,  Alfonzo Beers,  Darlys Gales    Role Performed            Anesthesiologist                Surgeon Primary                 Surgeon Secondary    Time In                   05/15/16 08:16:00               05/15/16 08:16:00               05/15/16 08:16:00    Time Out                  05/15/16 13:05:00  05/15/16 10:57:00               05/15/16 13:05:00    Procedure                 Mastectomy                      Mastectomy                      Autologous                              Bilateral(Bilateral),           Bilateral(Bilateral)            Injection(Left)                              Breast Reconstruction                              with Implant or                              Ti(Bilateral),                              Autologous                              Injection(Left)    Last Modified By:         Jess Barters, RN, Hansel Feinstein, RN, Hansel Feinstein, RNCorinna Capra                              05/15/16 13:06:49               05/15/16 13:06:49               05/15/16 13:06:49                                Entry 4                         Entry 5                         Entry 6                                           Case Attendee             Mitzi Hansen, RN, Herbert Deaner, RN, Hervey Ard  [facility]    Role Performed            Surgeon Secondary               Circulator                      Circulator    Time In                   05/15/16 08:16:00               05/15/16 08:16:00               05/15/16 08:16:00    Time Out                  05/15/16 13:05:00               05/15/16 13:05:00               05/15/16 13:05:00    Procedure                 Breast Reconstruction           Mastectomy                      Mastectomy                              with Implant or                 Bilateral(Bilateral),           Bilateral(Bilateral),                              Ti(Bilateral)                   Breast Reconstruction           Breast Reconstruction                                                              with Implant or                 with Implant or                                                              Ti(Bilateral),                  Ti(Bilateral),                                                              Autologous                      Autologous  Injection(Left)                 Injection(Left)    Last Modified By:         Jess Barters, RN, Hansel Feinstein, RN, Hansel Feinstein, RNCorinna Capra                              05/15/16 13:06:49               05/15/16 13:06:49               05/15/16 13:06:49                                Entry 7                         Entry 8                         Entry 9                                          Case Attendee             Pablo Lawrence, RN, Abby Potash,  [facility]                                                              A    Role Performed            Surgical Scrub                  First Assistant                 Other Authorized                                                                                               Personnel    Time In                   05/15/16 08:16:00               05/15/16 08:16:00               05/15/16 08:16:00    Time Out                  05/15/16 13:05:00  05/15/16 13:05:00               05/15/16 10:57:00    Procedure                 Mastectomy                      Breast Reconstruction           Mastectomy                              Bilateral(Bilateral),           with Implant or                 Bilateral(Bilateral)                              Breast Reconstruction           Ti(Bilateral)                              with Implant or                              Ti(Bilateral),                              Autologous                              Injection(Left)    Last Modified By:         Jess Barters, RN, Hansel Feinstein, RN, Hansel Feinstein, RNCorinna Capra                              05/15/16 13:06:49               05/15/16 13:06:49               05/15/16 13:06:49                                Entry 10                                                                                                         Case Attendee             Prentice Docker, RN, Celeste A    Role Performed            Surgical Scrub Relief    Time In  05/15/16 12:23:00    Time Out                  05/15/16 13:05:00    Procedure     Last Modified By:         Sherie Don                              05/15/16 13:06:49    General Comments:            Shanon Ace from [employer] for PRP      [facility] - Case Attendance Audit                                                                     05/15/16 13:06:49         Owner: [id number]                               Modifier: [id number]                                                            1     <*> Time Out            1     <*> Procedure            1     <*> Procedure                              Mastectomy Bilateral(Bilateral), Breast Reconstruction with                                                              Implant or Ti(Bilateral), Autologous Injection(Left)            1     <*> Procedure                              Mastectomy Bilateral(Bilateral), Breast Reconstruction with                                                             Implant or Ti(Bilateral), Autologous Injection(Left)            2     <*> Procedure            2     <*> Procedure  Mastectomy Bilateral(Bilateral)            2     <*> Procedure                              Mastectomy Bilateral(Bilateral)            3     <*> Time Out            3     <*> Procedure            3     <*> Procedure                              Autologous Injection(Left)            3     <*> Procedure                              Autologous Injection(Left)            4     <*> Time Out            4     <*> Procedure            4     <*> Procedure                              Breast Reconstruction with Implant or Ti(Bilateral)            4     <*> Procedure                              Breast Reconstruction with Implant or Ti(Bilateral)            5     <*> Time Out            5     <*> Procedure            5     <*> Procedure                              Mastectomy Bilateral(Bilateral), Breast Reconstruction with                                                             Implant or Ti(Bilateral), Autologous Injection(Left)            5     <*> Procedure                              Mastectomy Bilateral(Bilateral), Breast Reconstruction with                                                             Implant or Ti(Bilateral), Autologous Injection(Left)  6     <*> Time Out            6     <*> Procedure            6     <*> Procedure                              Mastectomy Bilateral(Bilateral), Breast Reconstruction with                                                             Implant or Ti(Bilateral), Autologous Injection(Left)            6     <*> Procedure                              Mastectomy Bilateral(Bilateral),  Breast Reconstruction with                                                             Implant or Ti(Bilateral), Autologous Injection(Left)            7     <*> Time Out            7     <*> Procedure            7     <*> Procedure                              Mastectomy Bilateral(Bilateral), Breast Reconstruction with                                                             Implant or Ti(Bilateral), Autologous Injection(Left)            7     <*> Procedure                              Mastectomy Bilateral(Bilateral), Breast Reconstruction with                                                             Implant or Ti(Bilateral), Autologous Injection(Left)            8     <*> Time Out            8     <*> Procedure            8     <*> Procedure  Breast Reconstruction with Implant or Ti(Bilateral)            8     <*> Procedure                              Breast Reconstruction with Implant or Ti(Bilateral)            9     <*> Procedure            9     <*> Procedure                              Mastectomy Bilateral(Bilateral)            9     <*> Procedure                              Mastectomy Bilateral(Bilateral)            10    <*> Time Out     05/15/16 12:23:56         Owner: [id number]                               Modifier: [id number]                                                            10    <*> Case Attendee                          Prentice Docker, RN, Celeste A            10    <*> Case Attendee                          Prentice Docker, RN, Celeste A            10    <*> Role Performed            10    <*> Role Performed            10    <*> Time In            10    <*> Time In     05/15/16 11:50:35         Owner: [id number]                               Modifier: [id number]                                                            2     <*> Time Out            2     <*> Time Out  2     <*> Time Out            2     <*> Time Out            2     <*> Procedure            2     <*>  Procedure            2     <*> Procedure            2     <*> Procedure                              Mastectomy Bilateral(Bilateral)            2     <*> Procedure                              Mastectomy Bilateral(Bilateral)            2     <*> Procedure                              Mastectomy Bilateral(Bilateral)            2     <*> Procedure                              Mastectomy Bilateral(Bilateral)            2     <*> Procedure                              Mastectomy Bilateral(Bilateral)            8     <*> Role Performed                         Other Authorized Personnel            8     <*> Role Performed                         Other Authorized Personnel            8     <*> Role Performed                         Other Authorized Personnel            8     <*> Role Performed                         Other Authorized Personnel            8     <*> Role Performed                         Other Authorized Personnel            8     <*> Procedure            8     <*> Procedure  8     <*> Procedure            8     <*> Procedure                              Mastectomy Bilateral(Bilateral), Breast Reconstruction with                                                             Implant or Ti(Bilateral), Autologous Injection(Left)            8     <*> Procedure                              Mastectomy Bilateral(Bilateral), Breast Reconstruction with                                                             Implant or Ti(Bilateral), Autologous Injection(Left)            8     <*> Procedure                              Mastectomy Bilateral(Bilateral), Breast Reconstruction with                                                             Implant or Ti(Bilateral), Autologous Injection(Left)            8     <*> Procedure                              Mastectomy Bilateral(Bilateral), Breast Reconstruction with                                                             Implant or Ti(Bilateral), Autologous  Injection(Left)            8     <*> Procedure                              Mastectomy Bilateral(Bilateral), Breast Reconstruction with                                                             Implant or Ti(Bilateral), Autologous Injection(Left)  9     <*> Time Out            9     <*> Time Out            9     <*> Time Out            9     <*> Time Out            9     <*> Procedure            9     <*> Procedure            9     <*> Procedure            9     <*> Procedure                              Mastectomy Bilateral(Bilateral), Breast Reconstruction with                                                             Implant or Ti(Bilateral), Autologous Injection(Left)            9     <*> Procedure                              Mastectomy Bilateral(Bilateral), Breast Reconstruction with                                                             Implant or Ti(Bilateral), Autologous Injection(Left)            9     <*> Procedure                              Mastectomy Bilateral(Bilateral), Breast Reconstruction with                                                             Implant or Ti(Bilateral), Autologous Injection(Left)            9     <*> Procedure                              Mastectomy Bilateral(Bilateral), Breast Reconstruction with                                                             Implant or Ti(Bilateral), Autologous Injection(Left)            9     <*>  Procedure                              Mastectomy Bilateral(Bilateral), Breast Reconstruction with                                                             Implant or Ti(Bilateral), Autologous Injection(Left)     05/15/16 09:03:41         Owner: [id number]                               Modifier: [id number]                                                            1     <*> Procedure            1     <*> Procedure            1     <*> Procedure            1     <*> Procedure            1     <*> Procedure                               Mastectomy Bilateral(Bilateral), Breast Reconstruction with                                                             Implant or Ti(Bilateral), Autologous Injection(Left)            1     <*> Procedure                              Mastectomy Bilateral(Bilateral), Breast Reconstruction with                                                             Implant or Ti(Bilateral), Autologous Injection(Left)            1     <*> Procedure                              Mastectomy Bilateral(Bilateral), Breast Reconstruction with  Implant or Ti(Bilateral), Autologous Injection(Left)            1     <*> Procedure                              Mastectomy Bilateral(Bilateral), Breast Reconstruction with                                                             Implant or Ti(Bilateral), Autologous Injection(Left)            1     <*> Procedure                              Mastectomy Bilateral(Bilateral), Breast Reconstruction with                                                             Implant or Ti(Bilateral), Autologous Injection(Left)            1     <*> Procedure                              Mastectomy Bilateral(Bilateral), Breast Reconstruction with                                                             Implant or Ti(Bilateral), Autologous Injection(Left)            2     <*> Procedure            2     <*> Procedure                              Mastectomy Bilateral(Bilateral)            2     <*> Procedure                              Mastectomy Bilateral(Bilateral)            2     <*> Procedure                              Mastectomy Bilateral(Bilateral)            2     <*> Procedure                              Mastectomy Bilateral(Bilateral)            2     <*> Procedure  Mastectomy Bilateral(Bilateral)            2     <*> Procedure                              Mastectomy Bilateral(Bilateral)            3     <*>  Procedure            3     <*> Procedure            3     <*> Procedure            3     <*> Procedure            3     <*> Procedure                              Autologous Injection(Left)            3     <*> Procedure                              Autologous Injection(Left)            3     <*> Procedure                              Autologous Injection(Left)            3     <*> Procedure                              Autologous Injection(Left)            3     <*> Procedure                              Autologous Injection(Left)            3     <*> Procedure                              Autologous Injection(Left)            4     <*> Procedure            4     <*> Procedure            4     <*> Procedure            4     <*> Procedure            4     <*> Procedure                              Breast Reconstruction with Implant or Ti(Bilateral)            4     <*> Procedure                              Breast Reconstruction with Implant or Ti(Bilateral)  4     <*> Procedure                              Breast Reconstruction with Implant or Ti(Bilateral)            4     <*> Procedure                              Breast Reconstruction with Implant or Ti(Bilateral)            4     <*> Procedure                              Breast Reconstruction with Implant or Ti(Bilateral)            4     <*> Procedure                              Breast Reconstruction with Implant or Ti(Bilateral)            5     <*> Procedure            5     <*> Procedure            5     <*> Procedure            5     <*> Procedure            5     <*> Procedure                              Mastectomy Bilateral(Bilateral), Breast Reconstruction with                                                             Implant or Ti(Bilateral), Autologous Injection(Left)            5     <*> Procedure                              Mastectomy Bilateral(Bilateral), Breast Reconstruction with                                                              Implant or Ti(Bilateral), Autologous Injection(Left)            5     <*> Procedure                              Mastectomy Bilateral(Bilateral), Breast Reconstruction with  Implant or Ti(Bilateral), Autologous Injection(Left)            5     <*> Procedure                              Mastectomy Bilateral(Bilateral), Breast Reconstruction with                                                             Implant or Ti(Bilateral), Autologous Injection(Left)            5     <*> Procedure                              Mastectomy Bilateral(Bilateral), Breast Reconstruction with                                                             Implant or Ti(Bilateral), Autologous Injection(Left)            5     <*> Procedure                              Mastectomy Bilateral(Bilateral), Breast Reconstruction with                                                             Implant or Ti(Bilateral), Autologous Injection(Left)            6     <*> Procedure            6     <*> Procedure            6     <*> Procedure            6     <*> Procedure            6     <*> Procedure                              Mastectomy Bilateral(Bilateral), Breast Reconstruction with                                                             Implant or Ti(Bilateral), Autologous Injection(Left)            6     <*> Procedure                              Mastectomy Bilateral(Bilateral), Breast Reconstruction with  Implant or Ti(Bilateral), Autologous Injection(Left)            6     <*> Procedure                              Mastectomy Bilateral(Bilateral), Breast Reconstruction with                                                             Implant or Ti(Bilateral), Autologous Injection(Left)            6     <*> Procedure                              Mastectomy Bilateral(Bilateral), Breast Reconstruction with                                                              Implant or Ti(Bilateral), Autologous Injection(Left)            6     <*> Procedure                              Mastectomy Bilateral(Bilateral), Breast Reconstruction with                                                             Implant or Ti(Bilateral), Autologous Injection(Left)            6     <*> Procedure                              Mastectomy Bilateral(Bilateral), Breast Reconstruction with                                                             Implant or Ti(Bilateral), Autologous Injection(Left)            7     <*> Procedure            7     <*> Procedure            7     <*> Procedure            7     <*> Procedure            7     <*> Procedure                              Mastectomy Bilateral(Bilateral), Breast Reconstruction with  Implant or Ti(Bilateral), Autologous Injection(Left)            7     <*> Procedure                              Mastectomy Bilateral(Bilateral), Breast Reconstruction with                                                             Implant or Ti(Bilateral), Autologous Injection(Left)            7     <*> Procedure                              Mastectomy Bilateral(Bilateral), Breast Reconstruction with                                                             Implant or Ti(Bilateral), Autologous Injection(Left)            7     <*> Procedure                              Mastectomy Bilateral(Bilateral), Breast Reconstruction with                                                             Implant or Ti(Bilateral), Autologous Injection(Left)            7     <*> Procedure                              Mastectomy Bilateral(Bilateral), Breast Reconstruction with                                                             Implant or Ti(Bilateral), Autologous Injection(Left)            7     <*> Procedure                              Mastectomy Bilateral(Bilateral),  Breast Reconstruction with                                                             Implant or Ti(Bilateral), Autologous Injection(Left)  8     <*> Time In            8     <*> Time In            8     <*> Time In            8     <*> Time In            8     <*> Time In            8     <*> Procedure            8     <*> Procedure                              Mastectomy Bilateral(Bilateral), Breast Reconstruction with                                                             Implant or Ti(Bilateral), Autologous Injection(Left)            8     <*> Procedure                              Mastectomy Bilateral(Bilateral), Breast Reconstruction with                                                             Implant or Ti(Bilateral), Autologous Injection(Left)            8     <*> Procedure                              Mastectomy Bilateral(Bilateral), Breast Reconstruction with                                                             Implant or Ti(Bilateral), Autologous Injection(Left)            8     <*> Procedure                              Mastectomy Bilateral(Bilateral), Breast Reconstruction with                                                             Implant or Ti(Bilateral), Autologous Injection(Left)            8     <*> Procedure  Mastectomy Bilateral(Bilateral), Breast Reconstruction with                                                             Implant or Ti(Bilateral), Autologous Injection(Left)            8     <*> Procedure                              Mastectomy Bilateral(Bilateral), Breast Reconstruction with                                                             Implant or Ti(Bilateral), Autologous Injection(Left)            9     <*> Time In            9     <*> Time In            9     <*> Time In            9     <*> Time In            9     <*> Time In            9     <*> Procedure            9     <*> Procedure                               Mastectomy Bilateral(Bilateral), Breast Reconstruction with                                                             Implant or Ti(Bilateral), Autologous Injection(Left)            9     <*> Procedure                              Mastectomy Bilateral(Bilateral), Breast Reconstruction with                                                             Implant or Ti(Bilateral), Autologous Injection(Left)            9     <*> Procedure                              Mastectomy Bilateral(Bilateral), Breast Reconstruction with  Implant or Ti(Bilateral), Autologous Injection(Left)            9     <*> Procedure                              Mastectomy Bilateral(Bilateral), Breast Reconstruction with                                                             Implant or Ti(Bilateral), Autologous Injection(Left)            9     <*> Procedure                              Mastectomy Bilateral(Bilateral), Breast Reconstruction with                                                             Implant or Ti(Bilateral), Autologous Injection(Left)            9     <*> Procedure                              Mastectomy Bilateral(Bilateral), Breast Reconstruction with                                                             Implant or Ti(Bilateral), Autologous Injection(Left)     05/15/16 08:49:22         Owner: [id number]                               Modifier: [id number]                                                            1     <*> Time In            1     <*> Time In            1     <*> Time In            1     <*> Time In            1     <*> Time In            1     <*> Procedure                              Mastectomy Bilateral(Bilateral), Breast Reconstruction with  Implant or Ti(Bilateral), Autologous Injection(Left)            1     <*> Procedure                              Mastectomy Bilateral(Bilateral),  Breast Reconstruction with                                                             Implant or Ti(Bilateral), Autologous Injection(Left)            1     <*> Procedure                              Mastectomy Bilateral(Bilateral), Breast Reconstruction with                                                             Implant or Ti(Bilateral), Autologous Injection(Left)            1     <*> Procedure                              Mastectomy Bilateral(Bilateral), Breast Reconstruction with                                                             Implant or Ti(Bilateral), Autologous Injection(Left)            1     <*> Procedure                              Mastectomy Bilateral(Bilateral), Breast Reconstruction with                                                             Implant or Ti(Bilateral), Autologous Injection(Left)            1     <*> Procedure                              Mastectomy Bilateral(Bilateral), Breast Reconstruction with                                                             Implant or Ti(Bilateral), Autologous Injection(Left)  2     <*> Time In            2     <*> Time In            2     <*> Time In            2     <*> Time In            2     <*> Time In            2     <*> Procedure                              Mastectomy Bilateral(Bilateral)            2     <*> Procedure                              Mastectomy Bilateral(Bilateral)            2     <*> Procedure                              Mastectomy Bilateral(Bilateral)            2     <*> Procedure                              Mastectomy Bilateral(Bilateral)            2     <*> Procedure                              Mastectomy Bilateral(Bilateral)            2     <*> Procedure                              Mastectomy Bilateral(Bilateral)            3     <*> Time In            3     <*> Time In            3     <*> Time In            3     <*> Time In            3     <*> Time In            3     <*> Procedure                               Autologous Injection(Left)            3     <*> Procedure                              Autologous Injection(Left)            3     <*> Procedure  Autologous Injection(Left)            3     <*> Procedure                              Autologous Injection(Left)            3     <*> Procedure                              Autologous Injection(Left)            3     <*> Procedure                              Autologous Injection(Left)            4     <*> Time In            4     <*> Time In            4     <*> Time In            4     <*> Time In            4     <*> Time In            4     <*> Procedure                              Breast Reconstruction with Implant or Ti(Bilateral)            4     <*> Procedure                              Breast Reconstruction with Implant or Ti(Bilateral)            4     <*> Procedure                              Breast Reconstruction with Implant or Ti(Bilateral)            4     <*> Procedure                              Breast Reconstruction with Implant or Ti(Bilateral)            4     <*> Procedure                              Breast Reconstruction with Implant or Ti(Bilateral)            4     <*> Procedure                              Breast Reconstruction with Implant or Ti(Bilateral)            5     <*> Time In            5     <*> Time In  5     <*> Time In            5     <*> Time In            5     <*> Time In            5     <*> Procedure                              Mastectomy Bilateral(Bilateral), Breast Reconstruction with                                                             Implant or Ti(Bilateral), Autologous Injection(Left)            5     <*> Procedure                              Mastectomy Bilateral(Bilateral), Breast Reconstruction with                                                             Implant or Ti(Bilateral), Autologous Injection(Left)            5     <*> Procedure                               Mastectomy Bilateral(Bilateral), Breast Reconstruction with                                                             Implant or Ti(Bilateral), Autologous Injection(Left)            5     <*> Procedure                              Mastectomy Bilateral(Bilateral), Breast Reconstruction with                                                             Implant or Ti(Bilateral), Autologous Injection(Left)            5     <*> Procedure                              Mastectomy Bilateral(Bilateral), Breast Reconstruction with  Implant or Ti(Bilateral), Autologous Injection(Left)            5     <*> Procedure                              Mastectomy Bilateral(Bilateral), Breast Reconstruction with                                                             Implant or Ti(Bilateral), Autologous Injection(Left)            6     <*> Time In            6     <*> Time In            6     <*> Time In            6     <*> Time In            6     <*> Time In            6     <*> Procedure                              Mastectomy Bilateral(Bilateral), Breast Reconstruction with                                                             Implant or Ti(Bilateral), Autologous Injection(Left)            6     <*> Procedure                              Mastectomy Bilateral(Bilateral), Breast Reconstruction with                                                             Implant or Ti(Bilateral), Autologous Injection(Left)            6     <*> Procedure                              Mastectomy Bilateral(Bilateral), Breast Reconstruction with                                                             Implant or Ti(Bilateral), Autologous Injection(Left)            6     <*> Procedure  Mastectomy Bilateral(Bilateral), Breast Reconstruction with                                                             Implant or Ti(Bilateral),  Autologous Injection(Left)            6     <*> Procedure                              Mastectomy Bilateral(Bilateral), Breast Reconstruction with                                                             Implant or Ti(Bilateral), Autologous Injection(Left)            6     <*> Procedure                              Mastectomy Bilateral(Bilateral), Breast Reconstruction with                                                             Implant or Ti(Bilateral), Autologous Injection(Left)            7     <*> Time In            7     <*> Time In            7     <*> Time In            7     <*> Time In            7     <*> Time In            7     <*> Procedure                              Mastectomy Bilateral(Bilateral), Breast Reconstruction with                                                             Implant or Ti(Bilateral), Autologous Injection(Left)            7     <*> Procedure                              Mastectomy Bilateral(Bilateral), Breast Reconstruction with  Implant or Ti(Bilateral), Autologous Injection(Left)            7     <*> Procedure                              Mastectomy Bilateral(Bilateral), Breast Reconstruction with                                                             Implant or Ti(Bilateral), Autologous Injection(Left)            7     <*> Procedure                              Mastectomy Bilateral(Bilateral), Breast Reconstruction with                                                             Implant or Ti(Bilateral), Autologous Injection(Left)            7     <*> Procedure                              Mastectomy Bilateral(Bilateral), Breast Reconstruction with                                                             Implant or Ti(Bilateral), Autologous Injection(Left)            7     <*> Procedure                              Mastectomy Bilateral(Bilateral), Breast Reconstruction with                                                              Implant or Ti(Bilateral), Autologous Injection(Left)            8     <*> Case Attendee                          Lonzo Cloud, RN, Christine A            8     <*> Case Attendee                          Lonzo Cloud, RN, Christine A            8     <*> Case Attendee  Lonzo Cloud, RN, Christine A            8     <*> Case Attendee                          Lonzo Cloud, RN, Delorse Limber            8     <*> Case Attendee                          Lonzo Cloud, RN, Christine A            8     <*> Role Performed            8     <+> Procedure            9     <*> Case Attendee                          HOLLAND-PA,  ERICKA            9     <*> Case Attendee                          HOLLAND-PA,  ERICKA            9     <*> Case Attendee                          HOLLAND-PA,  ERICKA            9     <*> Case Attendee                          HOLLAND-PA,  ERICKA            9     <*> Case Attendee                          HOLLAND-PA,  ERICKA            9     <*> Role Performed            9     <*> Role Performed            9     <*> Role Performed            9     <*> Role Performed            9     <*> Role Performed            9     <+> Procedure     05/15/16 07:42:58         Owner: [id number]                               Modifier: [id number]                                                            5     <*> Case Attendee  Jess Barters, RN, Corinna Capra            5     <*> Case Attendee                          Jess Barters, RN, Corinna Capra            5     <*> Case Attendee                          Jess Barters, RN, Corinna Capra            5     <*> Case Attendee                          Jess Barters, RN, Corinna Capra            5     <*> Case Attendee                          Jess Barters, RN, Corinna Capra            5     <*> Role Performed            5     <*> Role Performed            5     <*> Role Performed            5     <*> Role Performed            5     <*> Role Performed            5     <+>  Procedure            6     <*> Case Attendee                          Danella Deis RN, Hervey Ard            6     <*> Case Attendee                          Danella Deis RN, Hervey Ard            6     <*> Case Attendee                          Danella Deis RN, Hervey Ard            6     <*> Case Attendee                          Danella Deis RN, Hervey Ard            6     <*> Case Attendee                          Danella Deis, RN, Hervey Ard            6     <*> Role Performed            6     <*> Role Performed            6     <*>  Role Performed            6     <*> Role Performed            6     <*> Role Performed            6     <+> Procedure            7     <*> Case Attendee                          Annia Belt            7     <*> Case Attendee                          Annia Belt            7     <*> Case Attendee                          Annia Belt            7     <*> Case Attendee                          Annia Belt            7     <*> Case Attendee                          Annia Belt            7     <*> Role Performed            7     <*> Role Performed            7     <*> Role Performed            7     <*> Role Performed            7     <*> Role Performed            7     <+> Procedure        [facility] - Skin Assessment                                                                          Pre-Care Text:            A.240 Assesses baseline skin condition Im.120 Implements protective measures to prevent skin or tissue injury           due to mechanical sources  Im.280.1 Implements progective measures to prevent skin or tissue injury due to           thermal sources Im.360 Monitors for signs and symptons of infection                              Entry  1                                                                                                          Skin Integrity            Intact    Last Modified By:         Jess Barters, RN, Corinna Capra                              05/15/16  09:03:49    Post-Care Text:            E.10 Evaluates for signs and symptoms of physical injury to skin and tissue E.270 Evaluate tissue perfusion           O.60 Patient is free from signs and symptoms of injury caused by extraneous objects   O.210 Patinet's tissue           perfusion is consistent with or improved from baseline levels      MP[facility] - Patient Positioning                                                                      Pre-Care Text:            A.240 Assesses baseline skin condition A.280 Identifies baseline musculoskeletal status A.280.1 Identifies           physical alterations that require additional precautions for procedure-specific positioning A.510.8 Maintains           patient's dignity and privacy Im.120 Implements protective measures to prevent skin/tissue injury due to           mechanical sources Im.40 Positions the patient Im.80 Applies safety devices                              Entry 1                                                                                                          Procedure                 Mastectomy                      Body Position  Supine                              Bilateral(Bilateral),                              Breast Reconstruction                              with Implant or                              Ti(Bilateral),                              Autologous                              Injection(Left)    Left Arm Position         Extended on Padded Arm          Right Arm Position              Extended on Padded Arm                              Board w/Security Strap                                          Board w/Security Strap    Left Leg Position         Pillow Under Knee,              Right Leg Position              Pillow Under Knee,                              Extended Security Strap                                         Extended Security Strap    Feet Uncrossed            Yes                             Pressure Points                  Yes                                                              Checked     Positioning Device        Head Rest Foam, Heel            Positioned By  BARTLETT-MD,  ROBERT                              pad, Safety Strap                                               Holley Dexter, RN,                                                                                              Cathie Olden, RN, Herbert Deaner, RN, Hervey Ard    Outcome Met (O.80)        Yes    Last Modified By:         Jess Barters, RN, Corinna Capra                              05/15/16 09:03:25    Post-Care Text:            E.10 Evaluates for signs and symptoms of physical injury to skin and tissue E.290 Evaluates musculoskeletal           status O.80 Patient is free from signs and symptoms of injury related to positioning O.120 the patient is free  from signs and symptoms of injury related to transfer/transport  O.250 Patient's musculoskeletal status is           maintained at or improved from baseline levels      [facility] - Skin Prep                                                                                Pre-Care Text:            A.30 Verifies allergies A.20 Verifies procedure, surgical site, and laterality A.510.8 Maintains paritnet's           dignity and privacy Im.270 Performs Skin Preparation Im.270.1 Implements protective measures to prevent skin           and tissue injury due to chemical sources  A.300.1 Protects from cross-contamination                              Entry 1                                                                                                          Hair Removal     Skin Prep       Prep Agents (Im.270)     Chlorhexidine Gluconate         Prep Area (Im.270)              Breast, Chest and Trunk                              2% w/Alcohol     Prep Area Details        Bilateral                       Prep By                         Jess Barters RN, Corinna Capra    Outcome Met (O.100)       Yes    Last Modified By:         Jess Barters, RN, Corinna Capra                              05/15/16 09:42:35    Post-Care Text:            E.10 Evaluates for signs and symptoms of physical injury to skin and tissue O.100 Patient is free from signs           and symptoms of chemical injury  O.740 The patient's right to privacy is maintained  MP[facility] - Counts Initial and Final                                                                 Pre-Care Text:            A.20 Verifies operative procedure, sugical site, and laterality A.20.2 Assesses the risk for unintended           retained foreign body Im.20 Performs required counts                              Entry 1                                                                                                          Initial Counts      Initial Counts           Bidlen, RN, Corinna Capra,          Items included in               Sponges, Sharps     Performed By             Annia Belt              the Initial Count     Final Counts      Final Counts             Danella Deis, RN, Hervey Ard,         Final Count Status              Correct     Performed By             Prentice Docker, RN, Celeste A     Items Included in        Sponges, Sharps     Final Count     Outcome Met (O.20)        Yes    Last Modified By:         Sherie Don                              05/15/16 12:50:38    Post-Care Text:            E.50 Evaluates results of the surgical count O.20 Patient is free from unintended retained foreign objects      MPOR - Counts Initial and Final Audit  05/15/16 12:50:38         Owner: [id number]                                Modifier: [id number]                                                        <+> 1         Items Included in Final Count        <+> 1         Outcome Met (O.20)     05/15/16 12:50:28         Owner: [id number]                               Modifier: [id number]                                                        <+> 1         Final Counts Performed By        <+> 1         Final Count Status        MP[facility] - Counts Additional                                                                        Pre-Care Text:            A.20 Verifies operative procedure, sugical site, and laterality A.20.2 Assesses the risk for unintended           retained foreign body Im.20 Performs required counts                              Entry 1                                                                                                          Additional Count          Closing Count                   Additional Count                Danella Deis, RN, Hervey Ard,    Type  Participants                    Prentice Docker, Charity fundraiser, Engineer, maintenance A    Count Status              Correct                         Items Counted                   Sponges, Sharps    Outcome Met (O.20)        Yes    Last Modified By:         Jess Barters RNCorinna Capra                              05/15/16 12:42:57    Post-Care Text:            E.50 Evaluates results of the surgical count O.20 Patient is free from unintended retained foreign objects      MPOR - Counts Additional Audit                                                                   05/15/16 12:42:57         Owner: [id number]                               Modifier: [id number]                                                            1     <*> Additional Count Participants          Jess Barters, RN, Corinna Capra        [facility] - General Case Data                                                                        Pre-Care Text:            A.350.1 Classifies surgical wound                              Entry 1  Case Information      ASA Class                2                               Case Level                      Level 3     OR                       MP 04                           Specialty                       General (SN)     Wound Class              1-Clean    Preop Diagnosis           Z15.01    Last Modified By:         Sherie Don                              05/15/16 08:49:46    Post-Care Text:            O.760 Patient receives consistent and comparable care regardless of the setting      MP[facility] - Fire Risk Assessment                                                                                               Entry 1                                                                                                          Fire Risk                 Alcohol Based Prep              Fire Risk Score                 3    Assessment: If            Solution, Surgical Site    checked, checkmark        Above Xiphoid, Open    = 1 point                 Oxygen Source, Ignition  Source In Use    Last Modified By:         Jess Barters, RNCorinna Capra                              05/15/16 09:07:29      MP[facility] - Safety Checklist - Time Out                                                              Pre-Care Text:            A.10 Confirms patient identity A.20 Verifies operative procedure, surgical site, and laterality A.20.1 Verifies           consent for planned procedure A.30 Verifies allergies                              Entry 1                                                                                                          Surgical/Procedure        Yes                             Time Out Complete               05/15/16 08:57:00    Team confirms     correct patient,     correct site and     correct procedure     Last Modified By:         Sherie Don                               05/15/16 08:57:36    Post-Care Text:            E.30 Evaluates verification process for correct patient, site, side, and level surgery      MPOR - Safety Checklist - Time Out Addt                                                                                   Entry 1  Surgical/Procedure        Yes                             Time Out Complete               05/15/16 08:34:00    Team confirms     correct patient,     correct site and     correct procedure     Last Modified By:         Sherie Don                              05/15/16 08:44:45    General Comments:            ADDITIONAL TIMEOUT ON ENTRANCE OF [address]      MP[facility] - Safety Checklist - Time Out Addt Audit                                                    05/15/16 08:44:45         Owner: [id number]                               Modifier: [id number]                                                            1     <*> Time Out Complete            1     <*> Time Out Complete            1     <*> Time Out Complete            1     <*> Time Out Complete        [facility] - Cautery                                                                                  Pre-Care Text:            A.240 Assesses baseline skin condition A280.1 Identifies baseline musculoskeletal status Im.50 Implements           protective measures to prevent injury due to electrical sources  Im.60 Uses supplies and equipment within safe           parameters Im.80 Applies safety devices                              Entry 1  Entry 2                                                                          ESU Type                  GENERATOR                       GENERATOR                              COVIDIEN/[facility]LEYLAB              ENSEAL/HARMONIC    Identification            [id number]                          [phone]    Number     Coag Setting (watts)      50    Cut  Setting (watts)       50    Bipolar Setting     (watts)     Blend/Effect     Setting (watts)     Argon Plasma     Coagulator Mode     Flow Rate/Power     Level     Endocut     Endocut Energy     (watts)     Grounding Pad     Needed?     Grounding Pad Site        Thigh, left                     Thigh, right    Grounding [employer], RN, Hansel Feinstein, RN, Corinna Capra    Applied By     ESU Comment               [id number] x                       [id number] x    Outcome Met (O.10)        Yes                             Yes    Last Modified By:         Jess Barters RN, Hansel Feinstein, RN, Corinna Capra                              05/15/16 08:46:19               05/15/16 08:46:19    Post-Care Text:            E.10 Evaluates for signs and symptoms of physical injury to skin and tissue O.10 Patient is free from signs and           symptoms of injury related  to thermal sources  O.70 Patient is free from signs and symptoms of electrical injury      [facility] - Patient Care Devices                                                                     Pre-Care Text:            A.200 Assesses risk for normothermia regulation A.40 Verifies presence of prosthetics or corrective devices           Im.280 Implements thermoregulation measures Im.60 Uses supplies and equipment within safe parameters                              Entry 1                         Entry 2                                                                          Equipment Type            BAIR HUGGER                     MACHINE SEQUENTIAL                                                              COMPRESSION    SCD Sleeve Site                                           Legs Bilateral    Equipment/Tag Number      [id number]                          [id number]    Initiated Pre                                             Yes    Induction     Last Modified By:         Jess Barters RN, Hansel Feinstein, RNCorinna Capra                               05/15/16 09:06:57               05/15/16 09:06:57    Post-Care Text:  E.10 Evaluates signs and symptoms of physical injury to skin and tissue O.60 Patient is free from signs and           symptoms of injury caused by extraneous objects      [facility] - Medications                                                                              Pre-Care Text:            A.10 Confirms patient identity A.30 Verifies allergies Im.220 Administers prescribed medications Im.220.2           Administers prescribed antibiotic therapy as ordered                              Entry 1                                                                                                          Time Administered         05/15/16 11:26:00               Medication                      ADAMS SOLUTION    Route of Admin            Irrigation                      Dose/Volume                     1L                                                              (include amount and                                                               unit of measure)     Site                      Breast                          Site Detail  Bilateral    Administered By           Bronson Ing              Outcome Met (O.130)             Yes                              MAHLON    Last Modified By:         Jess Barters RN, Corinna Capra                              05/15/16 11:26:40    Post-Care Text:            E.20 Evaluates response to medications O.130 Patient receives appropriately administerd medication(s)      [facility] - Specimens                                                                                                          Entry 1                         Entry 2                                                                          Description               A. Left mastectomy.             B. Right mastectomy.                              Long single suture              LONG SINGLE SUTURE                              inferior  6 o clock.             INFERIOR 6 O CLOCK.                              long double lateral 3 o         LONG DOUBLE LATERAL 9 O                              clock. short double             CLOCK. SHORT DOUBLE  tissue behind nipple            TISSUE BEHIND NIPPLE                              483.8g    Specimen Type             Routine                         Routine    Date/Time in     Formalin (for     breast tissue only)     Last Modified By:         Jess Barters RN, Hansel Feinstein, RN, Corinna Capra                              05/15/16 10:24:18               05/15/16 10:44:44      [facility] - Specimens Audit                                                                           05/15/16 10:44:44         Owner: [id number]                               Modifier: [id number]                                                            2     <*> Description                            B. Right mastectomy.     05/15/16 10:24:18         Owner: [id number]                               Modifier: [id number]                                                            1     <*> Description                            A. Left mastectomy. Long single suture inferior 6 o clock. long  double lateral 3 o clock. short double tissue behind nipple        <+> 2         Description        <+> 2         Specimen Type     05/15/16 09:45:43         Owner: [id number]                               Modifier: [id number]                                                            1     <*> Description                            A.        [facility] - Implants/Endoscopy Stents                                                                Pre-Care Text:            A.20 Verifies operative procedure, surgical site, and laterality A.20.1 Verifies consent for planned procedure           Im.350 Records implants inserted during the operative or invasive procedure                              Entry  1                         Entry 2                         Entry 3                                          Implant/Explant           Implant                         Implant                         Implant    Catalog ????#                [id number]                         [id number]                         [id number]    Implant     Identification      Description              GRAFT ALLODERM DERMAL  GRAFT ALLODERM DERMAL           TISSUE EXPANDER BREAST                              MATRIX 16CM X 20CM              MATRIX 16CM X 20CM              ARTOURA 375CC HP                              THICK RTM (RTU)                 THICK RTM (RTU)     Expiration Date          02/10/18                        02/10/18                        02/13/20     Lot Number               [id number]                     [id number]                     [id number]     Unique ID Number         [id number]                           [id number]     Manufacturer             Life Cell                       Life Cell                       [employer]     Serial Number                                                                            [id number]    Usage Data      Implant Site             LEFT BREAST                     RIGHT BREAST                    LEFT BREAST     Quantity                 1                               1  1    Last Modified By:         Jess Barters, RN, Hansel Feinstein, RN, Hansel Feinstein, RNCorinna Capra                              05/15/16 11:26:06               05/15/16 11:42:14               05/15/16 11:42:14                                Entry 4                                                                                                          Implant/Explant           Implant    Catalog ????#                [id number]    Implant     Identification      Description              TISSUE EXPANDER BREAST                              ARTOURA 375CC HP     Expiration Date          12/05/19      Lot Number               [id number]     Unique ID Number      Manufacturer             [employer]     Serial Number            [id number]    Usage Data      Implant Site             RIGHT BREAST     Quantity                 1    Last Modified By:         Sherie Don                              05/15/16 11:42:14    Post-Care Text:            E.30 Evaluates verification process for correct patient, site, side and level surgery O.30 Patient's procedure           is performed on the correct site, side, and level      [facility] - Implants/Endoscopy Stents Audit  05/15/16 11:42:14         Owner: [id number]                               Modifier: [id number]                                                            2     <*> Description                            GRAFT ALLODERM DERMAL MATRIX 16CM X 20CM THICK RTM (RTU)            2     <*> Implant Site                           LEFT BREAST        <+> 3         Description        <+> 3         Serial Number        <+> 3         Lot Number        <+> 3         Manufacturer        <+> 3         Expiration Date        <+> 3         Implant Site        <+> 3         Quantity        <+> 3         Catalog ????#        <+> 3         Implant/Explant        <+> 4         Description        <+> 4         Serial Number        <+> 4         Lot Number        <+> 4         Manufacturer        <+> 4         Expiration Date        <+> 4         Implant Site        <+> 4         Quantity        <+> 4         Catalog ????#        <+> 4         Implant/Explant     05/15/16 11:26:06         Owner: [id number]                               Modifier: [id number]  1     <*> Description                            GRAFT ALLODERM DERMAL MATRIX 16CM X 20CM THICK RTM (RTU)            1     <*> Lot Number                             [id number]            1     <*> Manufacturer                           Life  Cell            1     <*> Expiration Date                        02/10/18            1     <*> Implant Site                           LEFT BREAST            1     <*> Quantity                               1            1     <*> Catalog ????#                             [id number]            1     <*> Implant/Explant                        Implant            1     <*> Unique ID Number                       [id number]            2     <*> Description                            TISSUE EXPANDER BREAST ARTOURA 375CC HP            2     <*> Lot Number                             [id number]            2     <*> Manufacturer                           [employer]            2     <*> Expiration Date                        02/13/20  2     <*> Implant Site                           LEFT BREAST            2     <*> Quantity                               1            2     <*> Catalog ????#                             [id number]            2     <*> Implant/Explant                        Implant            2     <+> Unique ID Number        [facility] - Tubes, Drains, Catheters                                                                 Pre-Care Text:            A.310 Identifies factors associated with an increased risk for hemorrhage or fluid and electrolyte imbalance           Im.250 Administers care to invasive device sites                              Entry 1                                                                                                          Device Description        DRAIN WOUND [place]             Location                        Breast                              PRATT 10FR W/ TROCAR    Location Detail           Bilateral    Last Modified By:         Sherie Don                              05/15/16 12:34:37  Post-Care Text:            E.340 Evaluates tubes and drains are intact and functioning as planned O.60 Patient is free from signs and           symptoms of injury caused by extraneous objects       [facility] - Tubes, Drains, Catheters Audit                                                            05/15/16 12:34:37         Owner: [id number]                               Modifier: [id number]                                                            1     <*> Device Description                     DRAIN WOUND FULL FLUTED 19FR W/ TROCAR HUBLESS        [facility] - Communication                                                                            Pre-Care Text:            A.520 Identifies barriers to communication (Patient and Family Communications) A.20 Verifies operative           procedure, surgical site, and laterality Sports coach) Im.500 Provides status reports to family           members Im.150 Develops individualized plan of care                              Entry 1                         Entry 2                                                                          Communication             Phone Call                      Face to Face    Communication By          Jess Barters, RN, Corinna Capra  Rogelia Rohrer    Date and Time             05/15/16 10:30:00               05/15/16 10:49:00    Communication To          HUSBAND                         HUSBAND    Last Modified By:         Jess Barters RN, Hansel Feinstein, RN, Corinna Capra                              05/15/16 11:20:01               05/15/16 11:20:01    Post-Care Text:            E.520 Evaluates psychosocial response to plan of care O.500 Patient or designated support person demonstrates           knowledge of the expected psychosocial responses to the procedure E.800 Ensures continuity of care O.50           Patient's current status is communicated throughout the continuum of care      [facility] - Dressing/Packing                                                                         Pre-Care Text:            A.350 Assesses susceptibility for infection Im.250 Administers care to invasive devices Im.290 Administer care           to wound  sites  Im.300 Implements aseptic technique                              Entry 1                                                                                                          Site                      Breast                          Site Details                    Bilateral    Dressing Item     Details      Dressing Item            Liquid Bandage     (  Im.290)     Last Modified By:         Jess Barters RN, Corinna Capra                              05/15/16 12:43:37    Post-Care Text:            E.320 Evaluate factors associted with increased risk for postoperative infection at the completion of the           procedure O.200 Patient's wound perfusion is consistent with or improved from baseline levels  O.Patient is           free from signs and symptoms of infection      [facility] - Procedures                                                                               Pre-Care Text:            A.20 Verifies operative procedure, surgical site, and laterality Im.150 Develops individualized plan of care                              Entry 1                         Entry 2                         Entry 3                                          Procedure     Description      Procedure                Mastectomy Bilateral            Breast Reconstruction           Autologous Injection                                                              with Implant or Tissue                                                              Expander     Modifiers                Bilateral                       Bilateral  Left     Surgical Procedure       BILATERAL NIPPLE AND            BILATERAL TISSUE                LEFT ACHILLES PRP     Text                     SKIN SPARING                    EXPANDER INSERTION W/           INJECTION                              MASTECTOMIES                    ALLODERM    Primary Procedure         Yes                             No                              No    Primary Surgeon            Christean Leaf                                                              [facility]    Start                     05/15/16 08:57:00               05/15/16 10:57:00               05/15/16 08:35:00    Stop                      05/15/16 10:50:00               05/15/16 13:03:00               05/15/16 08:40:00    Anesthesia Type           General                         General                         General    Surgical Service          General (SN)                    Plastic (SN)                    Orthopedic (SN)    Wound Class  1-Clean                         1-Clean                         1-Clean    Last Modified By:         Jess Barters, RN, Hansel Feinstein, RN, Hansel Feinstein, RNCorinna Capra                              05/15/16 11:42:44               05/15/16 13:06:45               05/15/16 08:47:03    Post-Care Text:            O.730 The patinet's care is consistent with the individualized perioperative plan of care      [facility] - Procedures Audit                                                                          05/15/16 13:06:45         Owner: [id number]                               Modifier: [id number]                                                        <+> 2         Stop     05/15/16 11:43:13         Owner: [id number]                               Modifier: [id number]                                                            2     <*> Procedure                              Breast Reconstruction with Implant or Tissue Expander            2     <*> Start                                  05/15/16 10:50:00     05/15/16 11:42:44         Owner: [id number]  Modifier: [id number]                                                            1     <*> Procedure                              Mastectomy Bilateral            1     <+> Stop            2     <*> Procedure                              Breast  Reconstruction with Implant or Tissue Expander            2     <*> Start                                  05/15/16 08:35:00     05/15/16 08:58:06         Owner: [id number]                               Modifier: [id number]                                                            1     <*> Procedure                              Mastectomy Bilateral            1     <*> Start                                  05/15/16 08:35:00        [facility] - Safety Checklist - Sign Out                                                              Pre-Care Text:            Im.330 Manages specimen handling and disposition                              Entry 1  Patient Safety            Yes    Communication Guide     Used Throughout Case     Last Modified By:         Sherie Don                              05/15/16 12:46:33    Post-Care Text:            E.800 Ensures continuity of care E.50 Evaluates results of the surgical count O.30 Patient's procedure is           performed on the correct site, side, and level O.50 patient's current status is communicated throughout the           continuum of care O.40 Patient's specimen(s) is managed in the appropriate manner      [facility] - Transfer                                                                                                           Entry 1                                                                                                          Transferred By            Deanna Artis            Via                             Lovena Le, RN,                              Corinna Capra    Post-op Destination       Nursing Unit    Skin Assessment      Condition                Intact    Last Modified By:         Sherie Don                              05/15/16 13:05:26      Case Comments                                                                                          <  None>              Finalized By: Noe Gens      Document Signatures                                                                             Signed By:           Sherie Don 05/15/16 13:06          Eliseo Gum,  Amber M 05/15/16 15:50          Eliseo Gum,  Amber M 05/16/16 09:37      Unfinalized History                                                                                     Date/Time            Username    Reason for Unfinalizing         Freetext Reason for Unfinalizing                                          05/15/16 15:48       MCLELLANA   Charging          05/16/16 09:37       [facility]   Charging

## 2016-05-16 NOTE — Discharge Summary (Signed)
 Inpatient Patient Summary               [facility]  [address] [place], [place] [address]  [phone]  Patient Discharge Instructions     Name: [name] [name], [name] [name]  Current Date: 05/16/2016 10:24:59  DOB: [birth year] [id number] FIN:NBR%[phone]  Patient Address: [address] [place] [facility] [address]  Patient Phone: [phone]  Primary Care Provider:  Name: Gillian Scarce  Phone: [phone]   Immunizations Provided:      Discharge Diagnosis: Achilles tendinitis  Discharged To: TO, ANTICIPATED%>Home with family support  Home Treatments: TREATMENTS, ANTICIPATED%>  Devices/Equipment: EQUIPMENT REHAB%>  Post Hospital Services: HOSPITAL SERVICES%>  Professional Skilled Services: SKILLED SERVICES%>  Therapist, sports and Community Resources: SERV AND COMM RES, ANTICIPATED%>  Mode of Discharge Transportation: TRANSPORTATION%>  Discharge Orders:         Discharge Patient 05/16/16 7:36:00 EST         Comment:   Medications  During the course of your visit, your medication list was updated with the most current information. The details of those changes are reflected below:         New Medications  Other Medications  diazepam (diazepam 5 mg oral tablet) 1 Tabs Oral (given by mouth) every 6 hours as needed muscle pain.  Last Dose:____________________  doxycycline (doxycycline hyclate 100 mg oral capsule) 1 Capsules Oral (given by mouth) 2 times a day.  Last Dose:____________________  Medications that have not changed  Other Medications  fluticasone nasal (Flonase 50 mcg/inh nasal spray) 1 Sprays Nasal (into the nose) every day as needed allergic rhinitis/nasal congestion.  Last Dose:____________________  hydrochlorothiazide 25 Milligram Oral (given by mouth) once a day (in the morning).  Last Dose:____________________  loratadine (Claritin 10 mg oral tablet) 1 Tabs Oral (given by mouth) once a day (in the evening) as needed.  Last Dose:____________________  potassium chloride (Klor-Con M10 oral tablet,  extended release) 2 Tabs Oral (given by mouth) once a day (in the morning).  Last Dose:____________________       [facility] would like to thank you for allowing [place] to assist you with your healthcare needs. The following includes patient education materials and information regarding your injury/illness.   [name], [name] has been given the following list of follow-up instructions, prescriptions, and patient education materials:  Follow-up Instructions:             With: Address: When:   [address]  [place], [place]  [address]  [phone] 05/17/2016 00:00:00                It is important to always keep an active list of medications available so that you can share with other providers and manage your medications appropriately. As an additional courtesy, we are also providing you with your final active medications list that you can keep with you.           diazepam (diazepam 5 mg oral tablet) 1 Tabs Oral (given by mouth) every 6 hours as needed muscle pain.  doxycycline (doxycycline hyclate 100 mg oral capsule) 1 Capsules Oral (given by mouth) 2 times a day.  fluticasone nasal (Flonase 50 mcg/inh nasal spray) 1 Sprays Nasal (into the nose) every day as needed allergic rhinitis/nasal congestion.  hydrochlorothiazide 25 Milligram Oral (given by mouth) once a day (in the morning).  loratadine (Claritin 10 mg oral tablet) 1 Tabs Oral (given by mouth) once a day (in  the evening) as needed.  potassium chloride (Klor-Con M10 oral tablet, extended release) 2 Tabs Oral (given by mouth) once a day (in the morning).      Take only the medications listed above. Contact your doctor prior to taking any medications not on this list.  Discharge instructions, if any, will display below     Instructions for Diet: INSTRUCTIONS FOR DIET%>  Instructions for Supplements: SUPPLEMENT INSTRUCTIONS%>  Instructions for Activity: INSTRUCTIONS FOR ACTIVITY%>  Instructions for Wound Care:  INSTRUCTIONS FOR WOUND CARE%>     Medication leaflets, if any, will display below     Patient education materials, if any, will display below            Doxycycline tablets or capsules   What is this medicine?   DOXYCYCLINE (dox i SYE kleen) is a tetracycline antibiotic. It kills certain bacteria or stops their growth. It is used to treat many kinds of infections, like dental, skin, respiratory, and urinary tract infections. It also treats acne, Lyme disease, malaria, and certain sexually transmitted infections.   How should I use this medicine?   Take this medicine by mouth with a full glass of water. Follow the directions on the prescription label. It is best to take this medicine without food, but if it upsets your stomach take it with food. Take your medicine at regular intervals. Do not take your medicine more often than directed. Take all of your medicine as directed even if you think you are better. Do not skip doses or stop your medicine early.   Talk to your pediatrician regarding the use of this medicine in children. While this drug may be prescribed for selected conditions, precautions do apply.   What side effects may I notice from receiving this medicine?   Side effects that you should report to your doctor or health care professional as soon as possible:     ?? allergic reactions like skin rash, itching or hives, swelling of the face, lips, or tongue     ?? difficulty breathing     ?? fever     ?? itching in the rectal or genital area     ?? pain on swallowing     ?? redness, blistering, peeling or loosening of the skin, including inside the mouth     ?? severe stomach pain or cramps     ?? unusual bleeding or bruising     ?? unusually weak or tired     ?? yellowing of the eyes or skin    Side effects that usually do not require medical attention (report to your doctor or health care professional if they continue or are bothersome):     ?? diarrhea     ?? loss of appetite     ?? nausea, vomiting    What may  interact with this medicine?    ?? antacids     ?? barbiturates     ?? birth control pills     ?? bismuth subsalicylate     ?? carbamazepine     ?? methoxyflurane     ?? other antibiotics     ?? phenytoin     ?? vitamins that contain iron     ?? warfarin    What if I miss a dose?   If you miss a dose, take it as soon as you can. If it is almost time for your next dose, take only that dose. Do not take double or extra doses.  Where should I keep my medicine?   Keep out of the reach of children.   Store at room temperature, below 30 degrees C (86 degrees F). Protect from light. Keep container tightly closed. Throw away any unused medicine after the expiration date. Taking this medicine after the expiration date can make you seriously ill.   What should I tell my health care provider before I take this medicine?   They need to know if you have any of these conditions:     ?? liver disease     ?? long exposure to sunlight like working outdoors     ?? stomach problems like colitis     ?? an unusual or allergic reaction to doxycycline, tetracycline antibiotics, other medicines, foods, dyes, or preservatives     ?? pregnant or trying to get pregnant     ?? breast-feeding    What should I watch for while using this medicine?   Tell your doctor or health care professional if your symptoms do not improve.   Do not treat diarrhea with over the counter products. Contact your doctor if you have diarrhea that lasts more than 2 days or if it is severe and watery.   Do not take this medicine just before going to bed. It may not dissolve properly when you lay down and can cause pain in your throat. Drink plenty of fluids while taking this medicine to also help reduce irritation in your throat.   This medicine can make you more sensitive to the sun. Keep out of the sun. If you cannot avoid being in the sun, wear protective clothing and use sunscreen. Do not use sun lamps or tanning beds/booths.   Birth control pills may not work properly while you  are taking this medicine. Talk to your doctor about using an extra method of birth control.   If you are being treated for a sexually transmitted infection, avoid sexual contact until you have finished your treatment. Your sexual partner may also need treatment.   Avoid antacids, aluminum, calcium, magnesium, and iron products for 4 hours before and 2 hours after taking a dose of this medicine.   If you are using this medicine to prevent malaria, you should still protect yourself from contact with mosquitos. Stay in screened-in areas, use mosquito nets, keep your body covered, and use an insect repellent.          NOTE:This sheet is a summary. It may not cover all possible information. If you have questions about this medicine, talk to your doctor, pharmacist, or health care provider. Copyright?? 2017 [employer] Standard             Diazepam tablets   What is this medicine?   DIAZEPAM (dye AZ e pam) is a benzodiazepine. It is used to treat anxiety and nervousness. It also can help treat alcohol withdrawal, relax muscles, and treat certain types of seizures.   How should I use this medicine?   Take this medicine by mouth with a glass of water. Follow the directions on the prescription label. If this medicine upsets your stomach, take it with food or milk. Take your doses at regular intervals. Do not take your medicine more often than directed. If you have been taking this medicine regularly for some time, do not suddenly stop taking it. You must gradually reduce the dose or you may get severe side effects. Ask your doctor or health care professional for advice. Even after you stop taking this medicine  it can still affect your body for several days.   A special MedGuide will be given to you by the pharmacist with each prescription and refill. Be sure to read this information carefully each time.   Talk to your pediatrician regarding the use of this medicine in children. Special care may be needed.   What side effects may I  notice from receiving this medicine?   Side effects that you should report to your doctor or health care professional as soon as possible:     ?? allergic reactions like skin rash, itching or hives, swelling of the face, lips, or tongue     ?? breathing problems     ?? confusion     ?? loss of balance or coordination     ?? signs and symptoms of low blood pressure like dizziness; feeling faint or lightheaded, falls; unusually weak or tired     ?? suicidal thoughts or other mood changes     ?? trouble passing urine or change in the amount of urine     Side effects that usually do not require medical attention (report to your doctor or health care professional if they continue or are bothersome):     ?? dizziness     ?? headache     ?? nausea, vomiting     ?? tiredness    What may interact with this medicine?   Do not take this medicine with any of the following medications:     ?? narcotic medicines for cough     ?? sodium oxybate     This medicine may also interact with the following medications:     ?? alcohol     ?? antacids     ?? antihistamines for allergy, cough and cold     ?? certain antibiotics like clarithromycin, erythromycin, rifampin     ?? certain medicines for anxiety or sleep     ?? certain medicines for blood pressure, heart disease, irregular heartbeat     ?? certain medicines for depression, like amitriptyline, fluoxetine, sertraline     ?? certain medicines for fungal infections like ketoconazole, itraconazole, clotrimazole     ?? certain medicines for psychotic disturbances     ?? certain medicines for seizures like carbamazepine, phenobarbital, phenytoin, primidone, valproate     ?? cimetidine     ?? cyclosporine     ?? dexamethasone     ?? general anesthetics like lidocaine, pramoxine, tetracaine     ?? MAOIs like Carbex, Eldepryl, Marplan, Nardil, and Parnate     ?? medicines that relax muscles for surgery     ?? narcotic medicines for pain     ?? omeprazole     ?? paclitaxel     ?? phenothiazines like chlorpromazine,  mesoridazine, prochlorperazine, thioridazine     ?? theophyline     ?? warfarin    What if I miss a dose?   If you miss a dose, take it as soon as you can. If it is almost time for your next dose, take only that dose. Do not take double or extra doses.   Where should I keep my medicine?   Keep out of the reach of children. This medicine can be abused. Keep your medicine in a safe place to protect it from theft. Do not share this medicine with anyone. Selling or giving away this medicine is dangerous and against the law.   This medicine may cause accidental overdose and death if taken by  other adults, children, or pets. Mix any unused medicine with a substance like cat litter or coffee grounds. Then throw the medicine away in a sealed container like a sealed bag or a coffee can with a lid. Do not use the medicine after the expiration date.    Store at room temperature between 15 and 30 degrees C (59 and 86 degrees F). Protect from light. Keep container tightly closed.   What should I tell my health care provider before I take this medicine?   They need to know if you have any of these conditions     ?? an alcohol or drug abuse problem     ?? bipolar disorder, depression, psychosis or other mental health condition     ?? glaucoma     ?? kidney or liver disease     ?? lung or breathing disease     ?? myasthenia gravis     ?? Parkinson's disease     ?? seizures or a history of seizures     ?? suicidal thoughts     ?? an unusual or allergic reaction to diazepam, other benzodiazepines, foods, dyes, or preservatives     ?? pregnant or trying to get pregnant     ?? breast-feeding    What should I watch for while using this medicine?   Tell your doctor or health care professional if your symptoms do not start to get better or if they get worse.   Do not stop taking except on your doctor's advice. You may develop a severe reaction. Your doctor will tell you how much medicine to take.   You may get drowsy or dizzy. Do not drive, use  machinery, or do anything that needs mental alertness until you know how this medicine affects you. To reduce the risk of dizzy and fainting spells, do not stand or sit up quickly, especially if you are an older patient. Alcohol may increase dizziness and drowsiness. Avoid alcoholic drinks.   If you are taking another medicine that also causes drowsiness, you may have more side effects. Give your health care provider a list of all medicines you use. Your doctor will tell you how much medicine to take. Do not take more medicine than directed. Call emergency for help if you have problems breathing or unusual sleepiness.          NOTE:This sheet is a summary. It may not cover all possible information. If you have questions about this medicine, talk to your doctor, pharmacist, or health care provider. Copyright?? 2017 Gold Standard         IS IT A STROKE? Act FAST and Check for these signs:    FACE                         Does the face look uneven?    ARM                         Does one arm drift down?    SPEECH                    Does their speech sound strange?    TIME                         Call 9-1-1 at any sign of stroke  Heart Attack Signs  Chest discomfort: Most heart attacks involve discomfort in  the center of the chest and lasts more than a few minutes, or goes away and comes back. It can feel like uncomfortable pressure, squeezing, fullness or pain.  Discomfort in upper body: Symptoms can include pain or discomfort in one or both arms, back, neck, jaw or stomach.  Shortness of breath: With or without discomfort.  Other signs: Breaking out in a cold sweat, nausea, or lightheaded.  Remember, MINUTES DO MATTER. If you experience any of these heart attack warning signs, call 9-1-1 to get immediate medical attention!     ---------------------------------------------------------------------------------------------------------------------  [facility] allows you to manage your health, view your test results,  and retrieve your discharge documents from your hospital stay securely and conveniently from your computer.  To begin the enrollment process, visit [email or web address] Click on ???Sign up now??? under [facility].   Yes - Patient/family/caregiver demonstrates understanding of instructions given        ______________________________                                 ___________________    Patient/Family/ Caregiver Signature                                                      Date/Time     ______________________________                                 ___________________    Provider Signature                                                                                        Date/Time

## 2016-05-16 NOTE — Nursing Note (Signed)
Nursing Discharge Summary - Text       Nursing Discharge Summary Entered On:  05/16/2016 10:18 EST    Performed On:  05/16/2016 10:15 EST by SWAN, RN, BETTY J               DC Information   Discharge To, Anticipated :   Home with family support   Accompanied By :   Spouse   Mode of Discharge :   Wheelchair   SWAN, RN, Ronnette Hila - 05/16/2016 10:15 EST   Education   Responsible Learner(s) :   Living Situation: Home with family support        Performed by: Berlinda Last - 05/15/2016 16:19  Discharge To: Home with family support        Performed by: Blane Ohara - 05/16/2016 00:31  Home Caregiver Name/Relationship: Joclynn Pedalino Husband [phone]        Performed by: Ronnald Collum, Donnelly Stager - 05/15/2016 16:19     Home Caregiver Present for Session :   Yes   Barriers To Learning :   None evident   Teaching Method :   Explanation, Printed materials   SWAN, RN, Ronnette Hila - 05/16/2016 10:15 EST   Post-Hospital Education Adult Grid   Activity Expectations :   Verbalizes understanding   Diagnostic Results :   Verbalizes understanding   Disease Process :   Verbalizes understanding   Equipment/Devices :   TEFL teacher understanding, Paediatric nurse   (Comment: JP drains and nerve block catheter management [SWAN, RN, BETTY J - 05/16/2016 10:15 EST] )   Importance of Follow-Up Visits :   Verbalizes understanding   Invasive Line Care :   Verbalizes understanding   Pain Management :   Verbalizes understanding   Physical Limitations :   Verbalizes understanding   Plan of Care :   Verbalizes understanding   Postoperative Instructions :   Verbalizes understanding   When to Call Health Care Provider :   [facility] understanding   SWAN, RN, Ronnette Hila - 05/16/2016 10:15 EST   Health Maintenance Education Adult Grid   Bathing/Hygiene :   TEFL teacher understanding   Diet/Nutrition :   TEFL teacher understanding   Exercise :   TEFL teacher understanding   Immunizations :   TEFL teacher understanding   SWAN, Charity fundraiser, Ronnette Hila - 05/16/2016 10:15 EST   Medication  Education Adult Grid   Med Dosage, Route, Scheduling :   TEFL teacher understanding   Med Generic/Brand Name, Purpose, Action :   Verbalizes understanding   Medication Precautions :   IT sales professional, Medication :   Verbalizes understanding   SWAN, RN, Ronnette Hila - 05/16/2016 10:15 EST   Safety Education Adult Scientist, research (medical), Fall :   Verbalizes understanding   SWAN, RN, Ronnette Hila - 05/16/2016 10:15 EST   Additional Learner(s) Present :   Spouse   Time Spent Educating Patient :   20 minutes   SWAN, RN, BETTY J - 05/16/2016 10:15 EST

## 2016-08-31 NOTE — Procedures (Signed)
 IntraOp Record - [facility]             IntraOp Record - SFOR Summary                                                                   Primary Physician:        Bronson Ing                              [facility]    Case Number:              [id number]    Finalized Date/Time:      08/31/16 18:30:13    Pt. Name:                 [name] [name], [name] [name]    D.O.B./Sex:               1965-11-19    Female    Med Rec #:                [id number]    Physician:                Bronson Ing                              [facility]    Financial #:              [id number]    Pt. Type:                 S    Room/Bed:                 /    Admit/Disch:              08/31/16 06:16:00 -                              08/31/16 12:33:00    Institution:       [facility] - Case Times                                                                                                         Entry 1  Patient      In Room Time             08/31/16 08:10:00               Out Room Time                   08/31/16 11:09:00    Anesthesia     Procedure      Start Time               08/31/16 08:58:00               Stop Time                       08/31/16 11:00:00    Last Modified By:         Narda Amber                              08/31/16 11:16:38      SFOR - Case Times Audit                                                                          08/31/16 11:16:38         Owner: Lawerance Cruel                               Modifier: HANNSA                                                        <+> 1         Out Room Time     08/31/16 11:08:21         Owner: Lawerance Cruel                               Modifier: HANNSA                                                        <+> 1         Stop Time        [facility] - Case Attendance  Entry 1                         Entry 2                          Entry 3                                          Case Attendee             KLINE-MD,  Paulo Fruit, RN, Leeanne Deed                              [facility]    Role Performed            Surgeon Primary                 Circulator                      Surgical Scrub    Time In                   08/31/16 08:10:00               08/31/16 08:10:00               08/31/16 08:10:00    Time Out                  08/31/16 11:09:00               08/31/16 11:09:00               08/31/16 11:09:00    Procedure                 Breast Implant                  Breast Implant                  Breast Implant                              Exchange(Bilateral),            Exchange(Bilateral),            Exchange(Bilateral),                              Fat Transfer                    Fat Transfer                    Fat Transfer                              (site)(Abdomen, Face)           (site)(Abdomen, Face)           (site)(Abdomen, Face)    Last Modified By:  Lorelle Formosa, RN, Maida Sale, RN, Maida Sale, RN, Dairl Ponder                              08/31/16 11:17:15               08/31/16 11:17:15               08/31/16 11:17:15                                Entry 4                         Entry 5                         Entry 6                                          Case Attendee             Pernell Dupre, RN, Herma Carson, RN, Nyoka Cowden,  DANA                                                              A                               WEEKS    Role Performed            Circulator Add'l                Circulator Add'l                CRNA    Time In                   08/31/16 08:10:00               08/31/16 08:10:00               08/31/16 08:10:00    Time Out                  08/31/16 08:30:00               08/31/16 11:09:00               08/31/16 11:09:00    Procedure                 Breast Implant                  Breast  Implant                  Breast Implant  Exchange(Bilateral),            Exchange(Bilateral),            Exchange(Bilateral),                              Fat Transfer                    Fat Transfer                    Fat Transfer                              (site)(Abdomen, Face)           (site)(Abdomen, Face)           (site)(Abdomen, Face)    Last Modified By:         Lorelle Formosa, RN, Maida Sale, RN, Maida Sale, RN, Dairl Ponder                              08/31/16 11:17:15               08/31/16 11:17:15               08/31/16 11:17:15                                Entry 7                         Entry 8                                                                          Case Attendee             Pura Spice    Role Performed            Anesthesiologist                CRNA    Time In                   08/31/16 08:10:00               08/31/16 08:10:00    Time Out                  08/31/16 11:09:00               08/31/16 11:09:00    Procedure                 Breast Implant  Breast Implant                              Exchange(Bilateral),            Exchange(Bilateral),                              Fat Transfer                    Fat Transfer                              (site)(Abdomen, Face)           (site)(Abdomen, Face)    Last Modified By:         Lorelle Formosa, RN, Maida Sale, RN, Dairl Ponder                              08/31/16 11:17:15               08/31/16 11:17:15    General Comments:            Tollie Pizza, NP ERIC Dorena Dew      FTDD - Case Attendance Audit                                                                     08/31/16 11:17:15         Owner: Lawerance Cruel                               Modifier: HANNSA                                                            1     <*> Time Out            1     <*> Time Out            1      <*> Time Out            1     <*> Procedure            1     <*> Procedure            1     <*> Procedure            1     <*> Procedure                              Breast Implant Exchange(Bilateral), Fat Transfer (site)(Abdomen,  Face)            1     <*> Procedure                              Breast Implant Exchange(Bilateral), Fat Transfer (site)(Abdomen,                                                             Face)            1     <*> Procedure                              Breast Implant Exchange(Bilateral), Fat Transfer (site)(Abdomen,                                                             Face)            1     <*> Procedure                              Breast Implant Exchange(Bilateral), Fat Transfer (site)(Abdomen,                                                             Face)            2     <*> Time Out            2     <*> Time Out            2     <*> Time Out            2     <*> Procedure            2     <*> Procedure            2     <*> Procedure            2     <*> Procedure                              Breast Implant Exchange(Bilateral), Fat Transfer (site)(Abdomen,                                                             Face)            2     <*> Procedure  Breast Implant Exchange(Bilateral), Fat Transfer (site)(Abdomen,                                                             Face)            2     <*> Procedure                              Breast Implant Exchange(Bilateral), Fat Transfer (site)(Abdomen,                                                             Face)            2     <*> Procedure                              Breast Implant Exchange(Bilateral), Fat Transfer (site)(Abdomen,                                                             Face)            3     <*> Time Out            3     <*> Time Out            3     <*> Time Out            3     <*> Procedure             3     <*> Procedure            3     <*> Procedure            3     <*> Procedure                              Breast Implant Exchange(Bilateral), Fat Transfer (site)(Abdomen,                                                             Face)            3     <*> Procedure                              Breast Implant Exchange(Bilateral), Fat Transfer (site)(Abdomen,  Face)            3     <*> Procedure                              Breast Implant Exchange(Bilateral), Fat Transfer (site)(Abdomen,                                                             Face)            3     <*> Procedure                              Breast Implant Exchange(Bilateral), Fat Transfer (site)(Abdomen,                                                             Face)            4     <*> Procedure            4     <*> Procedure            4     <*> Procedure            4     <*> Procedure                              Breast Implant Exchange(Bilateral), Fat Transfer (site)(Abdomen,                                                             Face)            4     <*> Procedure                              Breast Implant Exchange(Bilateral), Fat Transfer (site)(Abdomen,                                                             Face)            4     <*> Procedure                              Breast Implant Exchange(Bilateral), Fat Transfer (site)(Abdomen,  Face)            4     <*> Procedure                              Breast Implant Exchange(Bilateral), Fat Transfer (site)(Abdomen,                                                             Face)            5     <*> Time Out            5     <*> Time Out            5     <*> Time Out            5     <*> Procedure            5     <*> Procedure            5     <*> Procedure            5     <*> Procedure                              Breast Implant  Exchange(Bilateral), Fat Transfer (site)(Abdomen,                                                             Face)            5     <*> Procedure                              Breast Implant Exchange(Bilateral), Fat Transfer (site)(Abdomen,                                                             Face)            5     <*> Procedure                              Breast Implant Exchange(Bilateral), Fat Transfer (site)(Abdomen,                                                             Face)            5     <*> Procedure  Breast Implant Exchange(Bilateral), Fat Transfer (site)(Abdomen,                                                             Face)            6     <*> Time Out            6     <*> Time Out            6     <*> Time Out            6     <*> Procedure            6     <*> Procedure            6     <*> Procedure            6     <*> Procedure                              Breast Implant Exchange(Bilateral), Fat Transfer (site)(Abdomen,                                                             Face)            6     <*> Procedure                              Breast Implant Exchange(Bilateral), Fat Transfer (site)(Abdomen,                                                             Face)            6     <*> Procedure                              Breast Implant Exchange(Bilateral), Fat Transfer (site)(Abdomen,                                                             Face)            6     <*> Procedure                              Breast Implant Exchange(Bilateral), Fat Transfer (site)(Abdomen,  Face)            7     <*> Time Out            7     <*> Time Out            7     <*> Time Out            7     <*> Procedure            7     <*> Procedure            7     <*> Procedure            7     <*> Procedure                              Breast Implant Exchange(Bilateral), Fat Transfer (site)(Abdomen,                                                              Face)            7     <*> Procedure                              Breast Implant Exchange(Bilateral), Fat Transfer (site)(Abdomen,                                                             Face)            7     <*> Procedure                              Breast Implant Exchange(Bilateral), Fat Transfer (site)(Abdomen,                                                             Face)            7     <*> Procedure                              Breast Implant Exchange(Bilateral), Fat Transfer (site)(Abdomen,                                                             Face)            8     <*> Time Out            8     <*>  Time Out            8     <*> Time Out            8     <*> Procedure            8     <*> Procedure            8     <*> Procedure            8     <*> Procedure                              Breast Implant Exchange(Bilateral), Fat Transfer (site)(Abdomen,                                                             Face)            8     <*> Procedure                              Breast Implant Exchange(Bilateral), Fat Transfer (site)(Abdomen,                                                             Face)            8     <*> Procedure                              Breast Implant Exchange(Bilateral), Fat Transfer (site)(Abdomen,                                                             Face)            8     <*> Procedure                              Breast Implant Exchange(Bilateral), Fat Transfer (site)(Abdomen,                                                             Face)     08/31/16 10:21:53         Owner: HANNSA                               Modifier: HANNSA  1     <*> Procedure            1     <*> Procedure                              Breast Implant Exchange(Bilateral)            1     <*> Procedure                              Breast Implant Exchange(Bilateral)             1     <*> Procedure                              Breast Implant Exchange(Bilateral)            1     <*> Procedure                              Breast Implant Exchange(Bilateral)            1     <*> Procedure                              Breast Implant Exchange(Bilateral)            2     <*> Procedure            2     <*> Procedure                              Breast Implant Exchange(Bilateral)            2     <*> Procedure                              Breast Implant Exchange(Bilateral)            2     <*> Procedure                              Breast Implant Exchange(Bilateral)            2     <*> Procedure                              Breast Implant Exchange(Bilateral)            2     <*> Procedure                              Breast Implant Exchange(Bilateral)            3     <*> Procedure            3     <*> Procedure                              Breast Implant Exchange(Bilateral)  3     <*> Procedure                              Breast Implant Exchange(Bilateral)            3     <*> Procedure                              Breast Implant Exchange(Bilateral)            3     <*> Procedure                              Breast Implant Exchange(Bilateral)            3     <*> Procedure                              Breast Implant Exchange(Bilateral)            4     <*> Procedure            4     <*> Procedure                              Breast Implant Exchange(Bilateral)            4     <*> Procedure                              Breast Implant Exchange(Bilateral)            4     <*> Procedure                              Breast Implant Exchange(Bilateral)            4     <*> Procedure                              Breast Implant Exchange(Bilateral)            4     <*> Procedure                              Breast Implant Exchange(Bilateral)            5     <*> Procedure            5     <*> Procedure                              Breast Implant Exchange(Bilateral)            5     <*> Procedure                               Breast Implant Exchange(Bilateral)            5     <*> Procedure  Breast Implant Exchange(Bilateral)            5     <*> Procedure                              Breast Implant Exchange(Bilateral)            5     <*> Procedure                              Breast Implant Exchange(Bilateral)            6     <*> Procedure            6     <*> Procedure                              Breast Implant Exchange(Bilateral)            6     <*> Procedure                              Breast Implant Exchange(Bilateral)            6     <*> Procedure                              Breast Implant Exchange(Bilateral)            6     <*> Procedure                              Breast Implant Exchange(Bilateral)            6     <*> Procedure                              Breast Implant Exchange(Bilateral)            7     <*> Procedure            7     <*> Procedure                              Breast Implant Exchange(Bilateral)            7     <*> Procedure                              Breast Implant Exchange(Bilateral)            7     <*> Procedure                              Breast Implant Exchange(Bilateral)            7     <*> Procedure                              Breast Implant Exchange(Bilateral)  7     <*> Procedure                              Breast Implant Exchange(Bilateral)            8     <*> Procedure            8     <*> Procedure                              Breast Implant Exchange(Bilateral)            8     <*> Procedure                              Breast Implant Exchange(Bilateral)            8     <*> Procedure                              Breast Implant Exchange(Bilateral)            8     <*> Procedure                              Breast Implant Exchange(Bilateral)            8     <*> Procedure                              Breast Implant Exchange(Bilateral)     08/31/16 10:21:43         Owner: Lawerance Cruel                                Modifier: HANNSA                                                            1     <*> Procedure            1     <*> Procedure                              Breast Implant Exchange(Bilateral), Liposuction(Abdomen)            1     <*> Procedure                              Breast Implant Exchange(Bilateral), Liposuction(Abdomen)            1     <*> Procedure                              Breast Implant Exchange(Bilateral), Liposuction(Abdomen)            1     <*> Procedure  Breast Implant Exchange(Bilateral), Liposuction(Abdomen)            1     <*> Procedure                              Breast Implant Exchange(Bilateral), Liposuction(Abdomen)            1     <*> Procedure                              Breast Implant Exchange(Bilateral), Liposuction(Abdomen)            2     <*> Procedure            2     <*> Procedure                              Breast Implant Exchange(Bilateral), Liposuction(Abdomen)            2     <*> Procedure                              Breast Implant Exchange(Bilateral), Liposuction(Abdomen)            2     <*> Procedure                              Breast Implant Exchange(Bilateral), Liposuction(Abdomen)            2     <*> Procedure                              Breast Implant Exchange(Bilateral), Liposuction(Abdomen)            2     <*> Procedure                              Breast Implant Exchange(Bilateral), Liposuction(Abdomen)            2     <*> Procedure                              Breast Implant Exchange(Bilateral), Liposuction(Abdomen)            3     <*> Procedure            3     <*> Procedure                              Breast Implant Exchange(Bilateral), Liposuction(Abdomen)            3     <*> Procedure                              Breast Implant Exchange(Bilateral), Liposuction(Abdomen)            3     <*> Procedure                              Breast Implant Exchange(Bilateral),  Liposuction(Abdomen)            3     <*> Procedure                               Breast Implant Exchange(Bilateral), Liposuction(Abdomen)            3     <*> Procedure                              Breast Implant Exchange(Bilateral), Liposuction(Abdomen)            3     <*> Procedure                              Breast Implant Exchange(Bilateral), Liposuction(Abdomen)            4     <*> Procedure            4     <*> Procedure                              Breast Implant Exchange(Bilateral), Liposuction(Abdomen)            4     <*> Procedure                              Breast Implant Exchange(Bilateral), Liposuction(Abdomen)            4     <*> Procedure                              Breast Implant Exchange(Bilateral), Liposuction(Abdomen)            4     <*> Procedure                              Breast Implant Exchange(Bilateral), Liposuction(Abdomen)            4     <*> Procedure                              Breast Implant Exchange(Bilateral), Liposuction(Abdomen)            4     <*> Procedure                              Breast Implant Exchange(Bilateral), Liposuction(Abdomen)            5     <*> Procedure            5     <*> Procedure                              Breast Implant Exchange(Bilateral), Liposuction(Abdomen)            5     <*> Procedure                              Breast Implant Exchange(Bilateral), Liposuction(Abdomen)  5     <*> Procedure                              Breast Implant Exchange(Bilateral), Liposuction(Abdomen)            5     <*> Procedure                              Breast Implant Exchange(Bilateral), Liposuction(Abdomen)            5     <*> Procedure                              Breast Implant Exchange(Bilateral), Liposuction(Abdomen)            5     <*> Procedure                              Breast Implant Exchange(Bilateral), Liposuction(Abdomen)            6     <*> Procedure            6     <*> Procedure                              Breast Implant Exchange(Bilateral), Liposuction(Abdomen)            6     <*>  Procedure                              Breast Implant Exchange(Bilateral), Liposuction(Abdomen)            6     <*> Procedure                              Breast Implant Exchange(Bilateral), Liposuction(Abdomen)            6     <*> Procedure                              Breast Implant Exchange(Bilateral), Liposuction(Abdomen)            6     <*> Procedure                              Breast Implant Exchange(Bilateral), Liposuction(Abdomen)            6     <*> Procedure                              Breast Implant Exchange(Bilateral), Liposuction(Abdomen)            7     <*> Procedure            7     <*> Procedure                              Breast Implant Exchange(Bilateral), Liposuction(Abdomen)            7     <*>  Procedure                              Breast Implant Exchange(Bilateral), Liposuction(Abdomen)            7     <*> Procedure                              Breast Implant Exchange(Bilateral), Liposuction(Abdomen)            7     <*> Procedure                              Breast Implant Exchange(Bilateral), Liposuction(Abdomen)            7     <*> Procedure                              Breast Implant Exchange(Bilateral), Liposuction(Abdomen)            7     <*> Procedure                              Breast Implant Exchange(Bilateral), Liposuction(Abdomen)            8     <*> Procedure            8     <*> Procedure                              Breast Implant Exchange(Bilateral), Liposuction(Abdomen)            8     <*> Procedure                              Breast Implant Exchange(Bilateral), Liposuction(Abdomen)            8     <*> Procedure                              Breast Implant Exchange(Bilateral), Liposuction(Abdomen)            8     <*> Procedure                              Breast Implant Exchange(Bilateral), Liposuction(Abdomen)            8     <*> Procedure                              Breast Implant Exchange(Bilateral), Liposuction(Abdomen)            8     <*> Procedure                               Breast Implant Exchange(Bilateral), Liposuction(Abdomen)     08/31/16 10:08:43         Owner: Lawerance Cruel  Modifier: HANNSA                                                            1     <*> Procedure            1     <*> Procedure                              Breast Implant Exchange(Bilateral)            1     <*> Procedure                              Breast Implant Exchange(Bilateral)            1     <*> Procedure                              Breast Implant Exchange(Bilateral)            1     <*> Procedure                              Breast Implant Exchange(Bilateral)            1     <*> Procedure                              Breast Implant Exchange(Bilateral)            1     <*> Procedure                              Breast Implant Exchange(Bilateral)            1     <*> Procedure                              Breast Implant Exchange(Bilateral)            2     <*> Time In            2     <*> Time In            2     <*> Time In            2     <*> Time In            2     <*> Time In            2     <*> Time In            2     <*> Procedure            2     <*> Procedure                              Breast Implant Exchange(Bilateral)  2     <*> Procedure                              Breast Implant Exchange(Bilateral)            2     <*> Procedure                              Breast Implant Exchange(Bilateral)            2     <*> Procedure                              Breast Implant Exchange(Bilateral)            2     <*> Procedure                              Breast Implant Exchange(Bilateral)            2     <*> Procedure                              Breast Implant Exchange(Bilateral)            2     <*> Procedure                              Breast Implant Exchange(Bilateral)            3     <*> Time In            3     <*> Time In            3     <*> Time In            3     <*> Time In            3     <*> Time In            3      <*> Time In            3     <*> Procedure            3     <*> Procedure                              Breast Implant Exchange(Bilateral)            3     <*> Procedure                              Breast Implant Exchange(Bilateral)            3     <*> Procedure                              Breast Implant Exchange(Bilateral)            3     <*> Procedure  Breast Implant Exchange(Bilateral)            3     <*> Procedure                              Breast Implant Exchange(Bilateral)            3     <*> Procedure                              Breast Implant Exchange(Bilateral)            3     <*> Procedure                              Breast Implant Exchange(Bilateral)            4     <*> Time In            4     <*> Time In            4     <*> Time In            4     <*> Time In            4     <*> Time In            4     <*> Time In            4     <*> Procedure            4     <*> Procedure                              Breast Implant Exchange(Bilateral)            4     <*> Procedure                              Breast Implant Exchange(Bilateral)            4     <*> Procedure                              Breast Implant Exchange(Bilateral)            4     <*> Procedure                              Breast Implant Exchange(Bilateral)            4     <*> Procedure                              Breast Implant Exchange(Bilateral)            4     <*> Procedure                              Breast Implant Exchange(Bilateral)            4     <*>  Procedure                              Breast Implant Exchange(Bilateral)            5     <*> Time In            5     <*> Time In            5     <*> Time In            5     <*> Time In            5     <*> Time In            5     <*> Time In            5     <*> Procedure            5     <*> Procedure                              Breast Implant Exchange(Bilateral)            5     <*> Procedure                              Breast  Implant Exchange(Bilateral)            5     <*> Procedure                              Breast Implant Exchange(Bilateral)            5     <*> Procedure                              Breast Implant Exchange(Bilateral)            5     <*> Procedure                              Breast Implant Exchange(Bilateral)            5     <*> Procedure                              Breast Implant Exchange(Bilateral)            5     <*> Procedure                              Breast Implant Exchange(Bilateral)            6     <*> Time In            6     <*> Time In            6     <*> Time In            6     <*> Time In  6     <*> Time In            6     <*> Time In            6     <*> Procedure            6     <*> Procedure                              Breast Implant Exchange(Bilateral)            6     <*> Procedure                              Breast Implant Exchange(Bilateral)            6     <*> Procedure                              Breast Implant Exchange(Bilateral)            6     <*> Procedure                              Breast Implant Exchange(Bilateral)            6     <*> Procedure                              Breast Implant Exchange(Bilateral)            6     <*> Procedure                              Breast Implant Exchange(Bilateral)            6     <*> Procedure                              Breast Implant Exchange(Bilateral)            7     <*> Time In            7     <*> Time In            7     <*> Time In            7     <*> Time In            7     <*> Time In            7     <*> Time In            7     <*> Procedure            7     <*> Procedure                              Breast Implant Exchange(Bilateral)            7     <*> Procedure  Breast Implant Exchange(Bilateral)            7     <*> Procedure                              Breast Implant Exchange(Bilateral)            7     <*> Procedure                              Breast Implant  Exchange(Bilateral)            7     <*> Procedure                              Breast Implant Exchange(Bilateral)            7     <*> Procedure                              Breast Implant Exchange(Bilateral)            7     <*> Procedure                              Breast Implant Exchange(Bilateral)            8     <*> Time In            8     <*> Time In            8     <*> Time In            8     <*> Time In            8     <*> Time In            8     <*> Time In            8     <*> Procedure            8     <*> Procedure                              Breast Implant Exchange(Bilateral)            8     <*> Procedure                              Breast Implant Exchange(Bilateral)            8     <*> Procedure                              Breast Implant Exchange(Bilateral)            8     <*> Procedure                              Breast Implant Exchange(Bilateral)            8     <*>  Procedure                              Breast Implant Exchange(Bilateral)            8     <*> Procedure                              Breast Implant Exchange(Bilateral)            8     <*> Procedure                              Breast Implant Exchange(Bilateral)     08/31/16 09:32:42         Owner: Lawerance Cruel                               Modifier: HANNSA                                                            1     <*> Time In                                08/31/16 08:10:00            1     <*> Time In                                08/31/16 08:10:00            1     <*> Time In                                08/31/16 08:10:00            1     <*> Time In                                08/31/16 08:10:00            1     <*> Time In                                08/31/16 08:10:00            1     <*> Time In                                08/31/16 08:10:00            1     <*> Time In                                08/31/16 08:10:00  1     <*> Procedure                              Breast Implant  Exchange(Bilateral)            1     <*> Procedure                              Breast Implant Exchange(Bilateral)            1     <*> Procedure                              Breast Implant Exchange(Bilateral)            1     <*> Procedure                              Breast Implant Exchange(Bilateral)            1     <*> Procedure                              Breast Implant Exchange(Bilateral)            1     <*> Procedure                              Breast Implant Exchange(Bilateral)            1     <*> Procedure                              Breast Implant Exchange(Bilateral)            1     <*> Procedure                              Breast Implant Exchange(Bilateral)            1     <*> Procedure                              Breast Implant Exchange(Bilateral)            2     <*> Case Attendee                          Lorelle Formosa, RN, Dairl Ponder            2     <*> Case Attendee                          Lorelle Formosa, RN, Dairl Ponder            2     <*> Case Attendee                          Lorelle Formosa, RN, Samantha L            2     <*>  Case Attendee                          Lorelle Formosa, RN, Dairl Ponder            2     <*> Case Attendee                          Lorelle Formosa, RN, Dairl Ponder            2     <*> Case Attendee                          Lorelle Formosa, RN, Dairl Ponder            2     <*> Case Attendee                          Lorelle Formosa, RN, Dairl Ponder            2     <*> Role Performed                         Circulator            2     <*> Role Performed                         Circulator            2     <*> Role Performed                         Circulator            2     <*> Role Performed                         Circulator            2     <*> Role Performed                         Circulator            2     <*> Role Performed                         Circulator            2     <*> Role Performed                         Circulator            2     <*> Procedure                              Breast Implant Exchange(Bilateral)             3     <*> Case Attendee                          RHODES,  SUSAN D            3     <*> Case  Attendee                          RHODES,  SUSAN D            3     <*> Case Attendee                          RHODES,  SUSAN D            3     <*> Case Attendee                          RHODES,  SUSAN D            3     <*> Case Attendee                          RHODES,  SUSAN D            3     <*> Case Attendee                          RHODES,  SUSAN D            3     <*> Case Attendee                          RHODES,  SUSAN D            3     <*> Role Performed                         Surgical Scrub            3     <*> Role Performed                         Surgical Scrub            3     <*> Role Performed                         Surgical Scrub            3     <*> Role Performed                         Surgical Scrub            3     <*> Role Performed                         Surgical Scrub            3     <*> Role Performed                         Surgical Scrub            3     <*> Role Performed                         Surgical Scrub            3     <*>  Procedure                              Breast Implant Exchange(Bilateral)            4     <*> Case Attendee                          Pernell Dupre, RN, Lynne Logan            4     <*> Case Attendee                          Pernell Dupre, RN, Lynne Logan            4     <*> Case Attendee                          Pernell Dupre, RN, Lynne Logan            4     <*> Case Attendee                          Pernell Dupre, RN, Lynne Logan            4     <*> Case Attendee                          Pernell Dupre, RN, Lynne Logan            4     <*> Case Attendee                          Pernell Dupre, RN, Lynne Logan            4     <*> Case Attendee                          Pernell Dupre, RN, Larita Fife T            4     <*> Role Performed                         Circulator Add'l            4     <*> Role Performed                         Circulator Add'l            4     <*> Role Performed                         Circulator Add'l            4      <*> Role Performed                         Circulator Add'l            4     <*> Role Performed                         Circulator Add'l  4     <*> Role Performed                         Circulator Add'l            4     <*> Role Performed                         Circulator Add'l            4     <*> Time Out                               08/31/16 08:30:00            4     <*> Time Out                               08/31/16 08:30:00            4     <*> Time Out                               08/31/16 08:30:00            4     <*> Time Out                               08/31/16 08:30:00            4     <*> Time Out                               08/31/16 08:30:00            4     <*> Time Out                               08/31/16 08:30:00            4     <*> Time Out                               08/31/16 08:30:00            4     <*> Procedure                              Breast Implant Exchange(Bilateral)            5     <*> Case Attendee                          Lonzo Cloud, RN, Christine A            5     <*> Case Attendee                          Lonzo Cloud, RN, Christine A            5     <*> Case  Attendee                          Lonzo Cloud, RN, Christine A            5     <*> Case Attendee                          Lonzo Cloud, RN, Delorse Limber            5     <*> Case Attendee                          Lonzo Cloud, RN, Wynona Canes A            5     <*> Case Attendee                          Lonzo Cloud, RN, Delorse Limber            5     <*> Case Attendee                          Lonzo Cloud, RN, Christine A            5     <*> Role Performed                         Circulator Add'l            5     <*> Role Performed                         Circulator Add'l            5     <*> Role Performed                         Circulator Add'l            5     <*> Role Performed                         Circulator Add'l            5     <*> Role Performed                         Circulator Add'l            5     <*> Role  Performed                         Circulator Add'l            5     <*> Role Performed                         Circulator Add'l            5     <*> Procedure                              Breast Implant Exchange(Bilateral)            6     <*>  Case Attendee                          FLINNER-CRNA,  DANA WEEKS            6     <*> Case Attendee                          FLINNER-CRNA,  DANA WEEKS            6     <*> Case Attendee                          FLINNER-CRNA,  DANA WEEKS            6     <*> Case Attendee                          FLINNER-CRNA,  DANA WEEKS            6     <*> Case Attendee                          FLINNER-CRNA,  DANA WEEKS            6     <*> Case Attendee                          FLINNER-CRNA,  DANA WEEKS            6     <*> Case Attendee                          FLINNER-CRNA,  DANA WEEKS            6     <*> Role Performed                         CRNA            6     <*> Role Performed                         CRNA            6     <*> Role Performed                         CRNA            6     <*> Role Performed                         CRNA            6     <*> Role Performed                         CRNA            6     <*> Role Performed                         CRNA            6     <*>  Role Performed                         CRNA            6     <*> Procedure                              Breast Implant Exchange(Bilateral)            7     <*> Case Attendee                          BURNETTE-MD,  JEFFREY DEAN            7     <*> Case Attendee                          BURNETTE-MD,  JEFFREY DEAN            7     <*> Case Attendee                          BURNETTE-MD,  JEFFREY DEAN            7     <*> Case Attendee                          BURNETTE-MD,  JEFFREY DEAN            7     <*> Case Attendee                          BURNETTE-MD,  JEFFREY DEAN            7     <*> Case Attendee                          BURNETTE-MD,  JEFFREY DEAN            7     <*> Case Attendee                           BURNETTE-MD,  JEFFREY DEAN            7     <*> Role Performed                         Anesthesiologist            7     <*> Role Performed                         Anesthesiologist            7     <*> Role Performed                         Anesthesiologist            7     <*> Role Performed                         Anesthesiologist  7     <*> Role Performed                         Anesthesiologist            7     <*> Role Performed                         Anesthesiologist            7     <*> Role Performed                         Anesthesiologist            7     <*> Procedure                              Breast Implant Exchange(Bilateral)            8     <*> Case Attendee                          SHOEMAKER-CRNA,  LEIGH S            8     <*> Case Attendee                          SHOEMAKER-CRNA,  LEIGH S            8     <*> Case Attendee                          SHOEMAKER-CRNA,  LEIGH S            8     <*> Case Attendee                          SHOEMAKER-CRNA,  LEIGH S            8     <*> Case Attendee                          SHOEMAKER-CRNA,  LEIGH S            8     <*> Case Attendee                          SHOEMAKER-CRNA,  LEIGH S            8     <*> Case Attendee                          SHOEMAKER-CRNA,  LEIGH S            8     <*> Role Performed                         CRNA            8     <*> Role Performed                         CRNA  8     <*> Role Performed                         CRNA            8     <*> Role Performed                         CRNA            8     <*> Role Performed                         CRNA            8     <*> Role Performed                         CRNA            8     <*> Role Performed                         CRNA            8     <*> Procedure                              Breast Implant Exchange(Bilateral)        SFOR - Skin Assessment                                                                          Pre-Care Text:            A.240  Assesses baseline skin condition Im.120 Implements protective measures to prevent skin or tissue injury           due to mechanical sources  Im.280.1 Implements progective measures to prevent skin or tissue injury due to           thermal sources Im.360 Monitors for signs and symptons of infection                              Entry 1                                                                                                          Skin Integrity            Intact    Last Modified By:         Lorelle Formosa, RN, Dairl Ponder  08/31/16 09:36:28    Post-Care Text:            E.10 Evaluates for signs and symptoms of physical injury to skin and tissue E.270 Evaluate tissue perfusion           O.60 Patient is free from signs and symptoms of injury caused by extraneous objects   O.210 Patinet's tissue           perfusion is consistent with or improved from baseline levels      SFOR - Patient Positioning                                                                      Pre-Care Text:            A.240 Assesses baseline skin condition A.280 Identifies baseline musculoskeletal status A.280.1 Identifies           physical alterations that require additional precautions for procedure-specific positioning A.510.8 Maintains           patient's dignity and privacy Im.120 Implements protective measures to prevent skin/tissue injury due to           mechanical sources Im.40 Positions the patient Im.80 Applies safety devices                              Entry 1                                                                                                          Procedure                 Breast Implant                  Body Position                   Supine                              Exchange(Bilateral),                              Fat Transfer                              (site)(Abdomen, Face)    Left Arm Position         Extended on Padded Arm          Right Arm Position              Extended on Padded  Arm  Board w/Security Strap                                          Board w/Security Strap    Left Leg Position         Pillow Under Knee,              Right Leg Position              Pillow Under Knee,                              Extended Security Strap                                         Extended Security Strap    Feet Uncrossed            Yes                             Pressure Points                 Yes                                                              Checked     Positioning Device        Pillow, Heel pad                Positioned By                   Lorelle Formosa, RN, Dairl Ponder,                                                                                              FLINNER-CRNA,  DANA                                                                                              WEEKS, KLINE-MD,  RICHARD MAHLON    Outcome Met (O.80)        Yes    Last Modified By:         Lorelle Formosa, RN, Dairl Ponder                              08/31/16 10:21:56    Post-Care Text:            E.10 Evaluates for signs and symptoms of physical injury to skin and tissue E.290 Evaluates musculoskeletal           status O.80 Patient is free from signs and symptoms of injury related to positioning O.120 the patient is free           from signs and symptoms of injury related to transfer/transport  O.250 Patient's musculoskeletal status is           maintained at or improved from baseline levels      [facility] - Patient Positioning Audit                                                                 08/31/16 10:21:56         Owner: HANNSA                               Modifier: HANNSA                                                            1     <*> Procedure                              Breast Implant Exchange(Bilateral)     08/31/16 10:21:45         Owner: Lawerance Cruel                               Modifier: HANNSA                                                             1     <*> Procedure                              Breast Implant Exchange(Bilateral), Liposuction(Abdomen)     08/31/16 10:08:48         Owner: Lawerance Cruel                               Modifier: HANNSA  1     <*> Procedure                              Breast Implant Exchange(Bilateral)        SFOR - Skin Prep                                                                                Pre-Care Text:            A.30 Verifies allergies A.20 Verifies procedure, surgical site, and laterality A.510.8 Maintains paritnet's           dignity and privacy Im.270 Performs Skin Preparation Im.270.1 Implements protective measures to prevent skin           and tissue injury due to chemical sources  A.300.1 Protects from cross-contamination                              Entry 1                                                                                                          Hair Removal     Skin Prep      Prep Agents (Im.270)     Povidone Iodine                 Prep Area (Im.270)              Chest                              Solution 10%     Prep By                  Lorelle Formosa, RN, Samantha L    Outcome Met (O.100)       Yes    Last Modified By:         Lorelle Formosa, RN, Dairl Ponder                              08/31/16 09:49:05    Post-Care Text:            E.10 Evaluates for signs and symptoms of physical injury to skin and tissue O.100 Patient is free from signs           and symptoms of chemical injury  O.740 The patient's right to privacy is maintained      SFOR - Counts Initial and Final  Pre-Care Text:            A.20 Verifies operative procedure, sugical site, and laterality A.20.2 Assesses the risk for unintended           retained foreign body Im.20 Performs required counts                              Entry 1                                                                                                           Initial Counts      Initial Counts           RHODES,  SUSAN D,               Items included in               Sponges, Sharps     Performed By             Pernell Dupre, RN, Lynne Logan               the Initial Count     Final Counts      Final Counts             RHODES,  SUSAN D,               Final Count Status              Correct     Performed By             Lonzo Cloud, RN, Christine                              A     Items Included in        Sponges, Sharps     Final Count     Outcome Met (O.20)        Yes    Last Modified By:         Lorelle Formosa, RN, Dairl Ponder                              08/31/16 10:44:27    Post-Care Text:            E.50 Evaluates results of the surgical count O.20 Patient is free from unintended retained foreign objects      [facility] - Counts Initial and Final Audit                                                            08/31/16 10:44:27         Owner: Lawerance Cruel  Modifier: HANNSA                                                        <+> 1         Final Counts Performed By        <+> 1         Final Count Status        <+> 1         Items Included in Final Count        <+> 1         Outcome Met (O.20)        SFOR - Counts Additional                                                                        Pre-Care Text:            A.20 Verifies operative procedure, sugical site, and laterality A.20.2 Assesses the risk for unintended           retained foreign body Im.20 Performs required counts                              Entry 1                                                                                                          Additional Count          Closing Count                   Additional Count                Lonzo Cloud, RN, Christine    Type                                                      Participants                    A, RHODES,  SUSAN D    Count Status              Correct                         Items  Counted                   Sponges, Sharps    Outcome Met (O.20)  Yes    Last Modified By:         Lorelle Formosa, RN, Dairl Ponder                              08/31/16 10:39:45    Post-Care Text:            E.50 Evaluates results of the surgical count O.20 Patient is free from unintended retained foreign objects      [facility] - General Case Data                                                                        Pre-Care Text:            A.350.1 Classifies surgical wound                              Entry 1                                                                                                          Case Information      ASA Class                2                               Case Level                      Level 2     OR                       SF 02                           Specialty                       Plastic (SN)     Wound Class              1-Clean    Preop Diagnosis           N65.0                           Postop Diagnosis                N65.0    Last Modified By:         Lorelle Formosa, RN, Dairl Ponder  08/31/16 09:32:54    Post-Care Text:            O.760 Patient receives consistent and comparable care regardless of the setting      S[facility] - Fire Risk Assessment                                                                                               Entry 1                                                                                                          Fire Risk                 Alcohol Based Prep              Fire Risk Score                 3    Assessment: If            Solution, Surgical Site    checked, checkmark        Above Xiphoid, Ignition    = 1 point                 Source In Use    Last Modified By:         Lorelle Formosa, RN, Dairl Ponder                              08/31/16 09:35:56      S[facility] - Time Out                                                                                 Pre-Care Text:            A.10 Confirms patient identity A.20 Verifies operative  procedure, surgical site, and laterality A.20.1 Verifies           consent for planned procedure A.30 Verifies allergies                              Entry 1  Procedure                 Breast Implant                  Is everyone ready               Yes                              Exchange(Bilateral),            to perform time out                               Fat Transfer                              (site)(Abdomen, Face)    Have all members of       Yes                             Patient name and                Yes    the surgical team                                         DOB confirmed     been introduced     Allergies discussed       Yes                             Surgical procedure              Yes                                                              to be performed                                                               confirmed and                                                               verified by                                                               completed surgical  consent     Correct surgical          Yes                             Correct laterality              Yes    site marked and                                           confirmed     initials are     visible through     prepped and draped     field (or     alternative ID band     used)     Correct patient           Yes                             Surgeon shares                  Yes    position confirmed                                        operative plan,                                                               possible                                                               difficulties,                                                               expected duration,                                                                anticipated blood                                                               loss and reviews  all                                                               critical/specific                                                               concerns     Required blood            Yes                             Essential imaging               Yes    products, implants,                                       available and fetal     devices and/or                                            heartones confirmed     special equipment                                         (if applicable)     available and     sterility confirmed     VTE prophylaxis           Yes                             Antibiotics ordered             Yes    addressed                                                 and administered     Anesthesia shares         Yes                             Fire risk                       Yes    anesthetic plan and                                       assessment scored     reviews patient  and plan discussed     specific concerns     Appropriate drying        Yes                             Surgeon states:                 Yes    time for prep                                             Does anyone have     observed before                                           any concerns? If     draping                                                   you see, suspect,                                                               or feel that                                                               patient care is                                                               being compromised,                                                               speak up for                                                               patient safety     Time Out Complete         08/31/16 08:55:00    Last Modified By:          Lorelle Formosa, RN, Dairl Ponder  08/31/16 10:21:56    Post-Care Text:            E.30 Evaluates verification process for correct patient, site, side, and level surgery      S[facility] - Time Out Audit                                                                            08/31/16 10:21:56         Owner: HANNSA                               Modifier: HANNSA                                                            1     <*> Procedure                              Breast Implant Exchange(Bilateral)     08/31/16 10:21:47         Owner: Lawerance Cruel                               Modifier: HANNSA                                                            1     <*> Procedure                              Breast Implant Exchange(Bilateral), Liposuction(Abdomen)     08/31/16 10:08:49         Owner: Lawerance Cruel                               Modifier: HANNSA                                                            1     <*> Procedure                              Breast Implant Exchange(Bilateral)        [facility] - Debrief  Pre-Care Text:            Im.330 Manages specimen handling and disposition                              Entry 1                                                                                                          Final counts              Yes                             Actual procedure                Yes    correct and                                               performed confirmed     verbally verified     with     surgeon/licensed     independent     practitioner (if     applicable)     Postop diagnosis          Yes                             Wound                           Yes    confirmed                                                 classification                                                               confirmed     Confirm specimens         Yes                             Foley catheter                   Yes    and specimens  removed (if     labeled                                                   applicable)     appropriately (if     applicable)     Patient recovery          Yes                             Debrief Complete                08/31/16 10:59:00    plan confirmed     Last Modified By:         Lorelle Formosa RN, Dairl Ponder                              08/31/16 11:08:38    Post-Care Text:            E.800 Ensures continuity of care E.50 Evaluates results of the surgical count O.30 Patient's procedure is           performed on the correct site, side, and level O.50 patient's current status is communicated throughout the           continuum of care O.40 Patient's specimen(s) is managed in the appropriate manner      SFOR - Cautery                                                                                  Pre-Care Text:            A.240 Assesses baseline skin condition A280.1 Identifies baseline musculoskeletal status Im.50 Implements           protective measures to prevent injury due to electrical sources  Im.60 Uses supplies and equipment within safe           parameters Im.80 Applies safety devices                              Entry 1                                                                                                          ESU Type                  GENERATOR  Identification                  [id number]                              COVIDIEN/[facility]              Number     Coag Setting (watts)      30                              Cut Setting (watts)             20    Grounding Pad             Yes                             Grounding Pad Site              Thigh, right    Needed?     Grounding Reinaldo Meeker, RN, Lelon Mast L           Outcome Met (O.10)              Yes    Applied By     Last Modified By:         Lorelle Formosa, RN, Dairl Ponder                              08/31/16 09:34:05    Post-Care Text:            E.10 Evaluates for  signs and symptoms of physical injury to skin and tissue O.10 Patient is free from signs and           symptoms of injury related to thermal sources  O.70 Patient is free from signs and symptoms of electrical injury      S[facility] - Patient Care Devices                                                                     Pre-Care Text:            A.200 Assesses risk for normothermia regulation A.40 Verifies presence of prosthetics or corrective devices           Im.280 Implements thermoregulation measures Im.60 Uses supplies and equipment within safe parameters                              Entry 1  Equipment Type            MACHINE SEQUENTIAL              SCD Sleeve Site                 Legs Bilateral                              COMPRESSION    Equipment/Tag Number      [id number]                          Initiated Pre                   Yes                                                              Induction     Last Modified By:         Lorelle Formosa, RN, Dairl Ponder                              08/31/16 09:36:06    Post-Care Text:            E.10 Evaluates signs and symptoms of physical injury to skin and tissue O.60 Patient is free from signs and           symptoms of injury caused by extraneous objects      [facility] - Medications                                                                              Pre-Care Text:            A.10 Confirms patient identity A.30 Verifies allergies Im.220 Administers prescribed medications Im.220.2           Administers prescribed antibiotic therapy as ordered                              Entry 1                         Entry 2                         Entry 3                                          Time Administered  08/31/16 10:07:00    Medication                LIDOCAINE 1% W/EPI         ADAMS SOLUTION                   TUMESCENT SOLUTION                              VIAL    Route of Admin            Local Injection                 Irrigation                      Local Injection    Dose/Volume                                                                      (include amount and     unit of measure)     Site                      Chest                           Breast                          Abdomen    Site Detail               Bilateral                       Bilateral    Administered By           Delorise Jackson,  RICHARD              KLINE-MD,  RICHARD                              [facility]                          MAHLON                          MAHLON    Outcome Met (O.130)       Yes                             Yes                             Yes    Last Modified By:         Lorelle Formosa, RN, Maida Sale, RN, Maida Sale, RN,  Dairl Ponder                              08/31/16 10:42:51               08/31/16 10:43:07               08/31/16 10:07:52                                Entry 4                                                                                                          Time Administered     Medication                LIDOCAINE 1% W/EPI                              VIAL    Route of Admin            Local Injection    Dose/Volume                  (include amount and     unit of measure)     Site                      Face    Site Detail               Bilateral    Administered By           Neena Rhymes    Outcome Met (O.130)       Yes    Last Modified By:         Lorelle Formosa, RN, Dairl Ponder                              08/31/16 10:42:51    Post-Care Text:            E.20 Evaluates response to medications O.130 Patient receives appropriately administerd medication(s)    General Comments:            LIDOCAINE 1% WITH EPI TO FACE/LIPS AREA FOR FAT INJECTION      SFOR - Medications Audit  08/31/16 10:43:07         Owner: Lawerance Cruel                               Modifier: HANNSA                                                            2     <*> Medication                             ADAMS SOLUTION            2     <*> Medication                             ADAMS SOLUTION            2     <*> Medication                             ADAMS SOLUTION            2     <*> Medication                             ADAMS SOLUTION            2     <*> Medication                             ADAMS SOLUTION            2     <*> Medication                             ADAMS SOLUTION            2     <*> Medication                             ADAMS SOLUTION            2     <*> Outcome Met (O.130)                    Yes            2     <*> Outcome Met (O.130)                    Yes            2     <*> Outcome Met (O.130)                    Yes            2     <*> Site Detail                            Bilateral  2     <*> Site Detail                            Bilateral            2     <*> Site Detail                            Bilateral     08/31/16 10:42:51         Owner: HANNSA                               Modifier: HANNSA                                                            1     <*> Medication                             LIDOCAINE 1% W/EPI VIAL            1     <*> Medication                             LIDOCAINE 1% W/EPI VIAL            1     <*> Medication                             LIDOCAINE 1% W/EPI VIAL            1     <*> Medication                             LIDOCAINE 1% W/EPI VIAL            1     <*> Medication                             LIDOCAINE 1% W/EPI VIAL            1     <*> Medication                             LIDOCAINE 1% W/EPI VIAL            1     <*> Medication                             LIDOCAINE 1% W/EPI VIAL            1     <*> Medication                             LIDOCAINE  1% W/EPI VIAL            1     <*>  Medication                             LIDOCAINE 1% W/EPI VIAL            1     <*> Dose/Volume (include amount and                             unit of measure)            1     <*> Dose/Volume (include amount and                             unit of measure)            1     <*> Dose/Volume (include amount and                             unit of measure)            1     <*> Dose/Volume (include amount and                             unit of measure)            1     <*> Site Detail                            Bilateral            1     <*> Site Detail                            Bilateral            1     <*> Site Detail                            Bilateral            1     <*> Site Detail                            Bilateral            4     <*> Medication                             LIDOCAINE 1% W/EPI VIAL            4     <*> Medication                             LIDOCAINE 1% W/EPI VIAL            4     <*> Medication                             LIDOCAINE 1% W/EPI VIAL            4     <*>  Medication                             LIDOCAINE 1% W/EPI VIAL            4     <*> Route of Admin                         Local Injection            4     <*> Route of Admin                         Local Injection            4     <*> Route of Admin                         Local Injection            4     <*> Route of Admin                         Local Injection            4     <*> Administered By                        Bronson Ing MAHLON            4     <*> Administered By                        Neena Rhymes            4     <*> Administered By                        Neena Rhymes            4     <*> Administered By                        Neena Rhymes            4     <*> Dose/Volume (include amount and                             unit of measure)            4     <*> Dose/Volume (include  amount and                             unit of measure)            4     <*> Dose/Volume (include amount and                             unit of measure)            4     <*> Dose/Volume (include amount and         unit of measure)            4     <*> Outcome Met (O.130)                    Yes            4     <*> Outcome Met (O.130)                    Yes            4     <*> Outcome Met (O.130)                    Yes            4     <*> Outcome Met (O.130)                    Yes            4     <*> Site                                   Face            4     <*> Site                                   Face            4     <*> Site                                   Face            4     <*> Site                                   Face            4     <*> Site Detail                            Bilateral            4     <*> Site Detail                            Bilateral            4     <*> Site Detail                            Bilateral            4     <*> Site Detail                            Bilateral     08/31/16 10:07:52         Owner: Lawerance Cruel  Modifier: HANNSA                                                            2     <*> Medication                             ADAMS SOLUTION            2     <*> Route of Admin            2     <*> Route of Admin            2     <*> Route of Admin            2     <*> Route of Admin            2     <*> Administered By                        Bronson Ing MAHLON            2     <*> Administered By                        Neena Rhymes            2     <*> Administered By                        Neena Rhymes            2     <*> Administered By                        Neena Rhymes            2     <*> Dose/Volume (include amount and                      unit of measure)            2     <*> Dose/Volume (include amount and                      unit of measure)             2     <*> Dose/Volume (include amount and                      unit of measure)            2     <*> Dose/Volume (include amount and                      unit of measure)            2     <*> Site            2     <*> Site            2     <*> Site  2     <*> Site            3     <*> Medication                             TUMESCENT SOLUTION            3     <*> Medication                             TUMESCENT SOLUTION            3     <*> Medication                             TUMESCENT SOLUTION            3     <*> Medication                             TUMESCENT SOLUTION            3     <*> Route of Admin            3     <*> Route of Admin            3     <*> Route of Admin            3     <*> Route of Admin            3     <*> Administered By                        Bronson Ing MAHLON            3     <*> Administered By                        Neena Rhymes            3     <*> Administered By                        Neena Rhymes            3     <*> Administered By                        Neena Rhymes            3     <*> Dose/Volume (include amount and                      unit of measure)            3     <*> Dose/Volume (include amount and                      unit of measure)            3     <*> Dose/Volume (include amount and                      unit of measure)  3     <*> Dose/Volume (include amount and                      unit of measure)            3     <*> Time Administered            3     <*> Time Administered            3     <*> Time Administered            3     <*> Time Administered            3     <*> Outcome Met (O.130)            3     <*> Outcome Met (O.130)            3     <*> Outcome Met (O.130)            3     <*> Outcome Met (O.130)            3     <*> Site            3     <*> Site            3     <*> Site            3     <*> Site        SFOR - Implants/Endoscopy Stents                                                                 Pre-Care Text:            A.20 Verifies operative procedure, surgical site, and laterality A.20.1 Verifies consent for planned procedure           Im.350 Records implants inserted during the operative or invasive procedure                              Entry 1                         Entry 2                                                                          Implant/Explant           Implant                         Implant    Catalog ????#                [id number]                         [id number]  Implant     Identification      Description              NATRELLE INSPIRA                NATRELLE INSPIRA                              SOFTTOUCH                       SOFTTOUCH     Expiration Date          01/25/21                        08/22/20     Lot Number      Unique ID Number      Manufacturer             [facility]                        [facility]     Serial Number            [id number]                        [id number]    Usage Data      Implant Site             RIGHT BREAST                    LEFT BREAST     Quantity                 1                               1    Last Modified By:         Lorelle Formosa, RN, Maida Sale, RN, Dairl Ponder                              08/31/16 10:12:35               08/31/16 10:12:35    Post-Care Text:            E.30 Evaluates verification process for correct patient, site, side and level surgery O.30 Patient's procedure           is performed on the correct site, side, and level      SFOR - Communication                                                                            Pre-Care Text:            A.520 Identifies barriers to communication (Patient and Family Communications) A.20 Verifies operative           procedure, surgical site, and laterality Sports coach) Im.500 Provides status reports to family  members Im.150 Develops individualized plan of care                              Entry 1                                                                                                           Communication             Phone Call                      Communication By                Narda Amber    Date and Time             08/31/16 10:05:00               Communication To                [facility] Lanius    Last Modified By:         Lorelle Formosa RN, Dairl Ponder                              08/31/16 10:06:09    Post-Care Text:            E.520 Evaluates psychosocial response to plan of care O.500 Patient or designated support person demonstrates           knowledge of the expected psychosocial responses to the procedure E.800 Ensures continuity of care O.50           Patient's current status is communicated throughout the continuum of care      [facility] - Dressing/Packing                                                                         Pre-Care Text:            A.350 Assesses susceptibility for infection Im.250 Administers care to invasive devices Im.290 Administer care           to wound sites  Im.300 Implements aseptic technique                              Entry 1  Site                      Breast                          Site Details                    Bilateral    Dressing Item     Details      Dressing Item            Liquid Bandage     (Im.290)     Last Modified By:         Lorelle Formosa, RN, Dairl Ponder                              08/31/16 10:44:46    Post-Care Text:            E.320 Evaluate factors associted with increased risk for postoperative infection at the completion of the           procedure O.200 Patient's wound perfusion is consistent with or improved from baseline levels  O.Patient is           free from signs and symptoms of infection      [facility] - Procedures                                                                               Pre-Care Text:            A.20 Verifies operative procedure, surgical site, and  laterality Im.150 Develops individualized plan of care                              Entry 1                         Entry 2                                                                          Procedure     Description      Procedure                Breast Implant Exchange         Fat Transfer (site)     Modifiers                Bilateral                       Abdomen, Face     Surgical Procedure       TISSUE EXPANDERS                FAT GRAFTING FROM     Text  EXCHANGE TO PERMANENT           ABDOMEN TO LIPS                              IMPLANTS BILATERAL    Primary Procedure         Yes                             No    Primary Surgeon           Legrand Rams                              [facility]                          [facility]    Start                     08/31/16 08:58:00               08/31/16 10:20:00    Stop                      08/31/16 11:00:00               08/31/16 11:00:00    Anesthesia Type           General                         General    Surgical Service          Plastic (SN)                    Plastic (SN)    Wound Class               1-Clean                         1-Clean    Last Modified By:         Lorelle Formosa, RN, Maida Sale, RN, Dairl Ponder                              08/31/16 11:08:22               08/31/16 11:08:22    Post-Care Text:            O.730 The patient's care is consistent with the individualized perioperative plan of care      [facility] - Procedures Audit                                                                          08/31/16 11:08:22         Owner: Lawerance Cruel  Modifier: HANNSA                                                        <+> 1         Stop        <+> 2         Stop     08/31/16 10:24:54         Owner: HANNSA                               Modifier: HANNSA                                                            2     <*> Procedure                              Fat Transfer  (site)            2     <*> Start                                  08/31/16 10:20:00     08/31/16 10:21:49         Owner: HANNSA                               Modifier: HANNSA                                                            2     <*> Procedure                              Liposuction            2     <*> Modifiers                              Abdomen            2     <+> Surgical Procedure Text     08/31/16 10:10:35         Owner: HANNSA                               Modifier: HANNSA                                                        <+>  2         Start        [employer] - Transfer                                                                                                           Entry 1                                                                                                          Transferred By            Hinda Lenis             Via                             Stretcher                              Tilda Franco, RN,                              Dairl Ponder    Post-op Destination       PACU    Skin Assessment      Condition                Intact    Last Modified By:         Narda Amber                              08/31/16 10:24:59      Case Comments                                                                                         <None>              Finalized By: Meryle Ready      Document Signatures  Signed By:           Lorelle Formosa, RN, Dairl Ponder 08/31/16 11:17          Shona Simpson,  JENNIFER 08/31/16 18:30      Unfinalized History                                                                                     Date/Time            Username    Reason for Unfinalizing         Freetext Reason for Unfinalizing                                          08/31/16 18:29       [employer]      Charging

## 2016-08-31 NOTE — Discharge Summary (Signed)
 Inpatient Patient Summary               [facility]  [address]  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 The details of those changes are reflected below:         New Medications  Other Medications  A Patient Specific Refrigerated Medication Kit-Combination Routes every 5 minutes as needed other (see comment).  Last Dose:____________________  amoxicillin-clavulanate (Augmentin 875 mg-125 mg oral tablet) 875 mg Oral (given by mouth) every 12 hours.  Last Dose:____________________  Medications that have not changed  Other Medications  lisinopril 5 Milligram Oral (given by mouth) every day.  Last Dose:____________________  loratadine (Claritin 10 mg oral tablet) 1 Tabs Oral (given by mouth) once a day (in the evening) as needed as needed for allergy symptoms.  Last Dose:____________________         [facility] would like to thank you for allowing [place] to assist you with your healthcare needs. The following includes patient education materials and  information regarding your injury/illness.     [name], [name] has been given the following list of follow-up instructions, prescriptions, and patient education materials:  Follow-up Instructions:             With: Address: When:   [address]  [place], [place]  [address]  [phone] 09/01/2016 00:00:00                    Type Location Start [place]   US Kidneys Only [facility] 09/01/2016 13:30:00 09/01/2016 14:00:00 Confirmed              It is important to always keep an active list of medications available so that you can share with other providers and manage your medications appropriately. As an additional courtesy, we are also providing you with your final active medications list that you can keep with you.           A Patient Specific Refrigerated Medication Kit-Combination Routes every 5 minutes as needed other (see comment).  amoxicillin-clavulanate (Augmentin 875 mg-125 mg oral tablet) 875 mg Oral (given by mouth) every 12 hours.  lisinopril 5 Milligram Oral (given by mouth) every day.  loratadine (Claritin 10 mg oral tablet) 1 Tabs Oral (given by mouth) once a day (in the evening) as needed as needed for allergy symptoms.      Take only the medications listed above. Contact your  doctor prior to taking any medications not on this list.        Discharge instructions, if any, will display below     Instructions for Diet: INSTRUCTIONS FOR DIET%>   Instructions for Supplements: SUPPLEMENT INSTRUCTIONS%>   Instructions for Activity: INSTRUCTIONS FOR ACTIVITY%>   Instructions for Wound Care: INSTRUCTIONS FOR WOUND CARE%>     Medication leaflets, if any, will display below     Patient education materials, if any, will display below        Discharge Instructions: After Your Surgery   Youve just had surgery. During surgery, you were given medicine called anesthesia to keep you relaxed and free of pain. After surgery, you may have some pain or nausea. This is common. Here are  some tips for feeling better and getting well after surgery.       Stay on schedule with your medicine.    Going home   Your healthcare provider will show you how to take care of yourself when you go home. He or she will also answer your questions. Have an adult family member or friend drive you home. For the first 24 hours after your surgery:   ?? Do not drive or use heavy equipment.   ?? Do not make important decisions or sign legal papers.   ?? Do not drink alcohol.   ?? Have someone stay with you, if needed. He or she can watch for problems and help keep you safe.   Be sure to go to all follow-up visits with your healthcare provider. And rest after your surgery for as long as your healthcare provider tells you to.   Coping with pain   If you have pain after surgery, pain medicine will help you feel better. Take it as told, before pain becomes severe. Also, ask your healthcare provider or pharmacist about other ways to control pain. This might be with heat, ice, or relaxation. And follow any other instructions your surgeon or nurse gives you.   Tips for taking pain medicine   To get the best relief possible, remember these points:   ?? Pain medicines can upset your stomach. Taking them with a little food may help.   ?? Most pain relievers taken by mouth need at least 20 to 30 minutes to start to work.   ?? Taking medicine on a schedule can help you remember to take it. Try to time your medicine so that you can take it before starting an activity. This might be before you get dressed, go for a walk, or sit down for dinner.   ?? Constipation is a common side effect of pain medicines. Call your healthcare provider before taking any medicines such as laxatives or stool softeners to help ease constipation. Also ask if you should skip any foods. Drinking lots of fluids and eating foods such as fruits and vegetables that are high in fiber can also help. Remember, do not take laxatives unless your surgeon has prescribed them.    ?? Drinking alcohol and taking pain medicine can cause dizziness and slow your breathing. It can even be deadly. Do not drink alcohol while taking pain medicine.   ?? Pain medicine can make you react more slowly to things. Do not drive or run machinery while taking pain medicine.   Your healthcare provider may tell you to take acetaminophen to help ease your pain. Ask him or her how much you are supposed to take each day. Acetaminophen or other pain relievers may  interact with your prescription medicines or other over-the-counter (OTC) medicines. Some prescription medicines have acetaminophen and other ingredients. Using both prescription and OTC acetaminophen for pain can cause you to overdose. Read the labels on your OTC medicines with care. This will help you to clearly know the list of ingredients, how much to take, and any warnings. It may also help you not take too much acetaminophen. If you have questions or do not understand the information, ask your pharmacist or healthcare provider to explain it to you before you take the OTC medicine.   Managing nausea   Some people have an upset stomach after surgery. This is often because of anesthesia, pain, or pain medicine, or the stress of surgery. These tips will help you handle nausea and eat healthy foods as you get better. If you were on a special food plan before surgery, ask your healthcare provider if you should follow it while you get better. These tips may help:   ?? Do not push yourself to eat. Your body will tell you when to eat and how much.   ?? Start off with clear liquids and soup. They are easier to digest.   ?? Next try semi-solid foods, such as mashed potatoes, applesauce, and gelatin, as you feel ready.   ?? Slowly move to solid foods. Dont eat fatty, rich, or spicy foods at first.   ?? Do not force yourself to have 3 large meals a day. Instead eat smaller amounts more often.   ?? Take pain medicines with a small amount of solid food, such as crackers or  toast, to avoid nausea.       Call your surgeon if???   ?? You still have pain an hour after taking medicine. The medicine may not be strong enough.   ?? You feel too sleepy, dizzy, or groggy. The medicine may be too strong.   ?? You have side effects like nausea, vomiting, or skin changes, such as rash, itching, or hives.        If you have obstructive sleep apnea   You were given anesthesia medicine during surgery to keep you comfortable and free of pain. After surgery, you may have more apnea spells because of this medicine and other medicines you were given. The spells may last longer than usual.    At home:   ?? Keep using the continuous positive airway pressure (CPAP) device when you sleep. Unless your healthcare provider tells you not to, use it when you sleep, day or night. CPAP is a common device used to treat obstructive sleep apnea.   ?? Talk with your provider before taking any pain medicine, muscle relaxants, or sedatives. Your provider will tell you about the possible dangers of taking these medicines.     ?? 2000-2017 [employer] [address] [place], [place] [address]. All rights reserved. This information is not intended as a substitute for professional medical care. Always follow your healthcare professional's instructions.            IS IT A STROKE? Act FAST and Check for these signs:    FACE                         Does the face look uneven?    ARM                         Does one arm drift down?  SPEECH                    Does their speech sound strange?    TIME                         Call 9-1-1 at any sign of stroke  ---------------------------------------------------------------------------------------------------------------------------  Heart Attack Signs  Chest discomfort: Most heart attacks involve discomfort in the center of the chest and lasts more than a few minutes, or goes away and comes back. It can feel like uncomfortable pressure, squeezing, fullness or  pain.  Discomfort in upper body: Symptoms can include pain or discomfort in one or both arms, back, neck, jaw or stomach.  Shortness of breath: With or without discomfort.  Other signs: Breaking out in a cold sweat, nausea, or lightheaded.  Remember, MINUTES DO MATTER. If you experience any of these heart attack warning signs, call 9-1-1 to get immediate medical attention!     ---------------------------------------------------------------------------------------------------------------------------  [facility] allows you to manage your health, view your test results, and retrieve your discharge documents from your hospital stay securely and conveniently from your computer.  To begin the enrollment process, visit [email or web address] Click on ???Sign up now??? under [facility].

## 2016-08-31 NOTE — Discharge Summary (Signed)
 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  Comment:       PATIENT EDUCATION INFORMATION  Instructions:             Anesthesia: After Your Surgery  Medication Leaflets:               Follow-up:                          With: Address: When:   [address]  [place], [place]  [address]  [phone] 09/01/2016 00:00:00                                 Type Location Start [place]   US Kidneys Only [facility] 09/01/2016 13:30:00 09/01/2016 14:00:00 Confirmed          MEDICATION LIST  Medication Reconciliation at Discharge:         New Medications  Other Medications  A Patient Specific Refrigerated Medication Kit-Combination Routes every 5 minutes as needed other (see comment).  Last Dose:____________________  amoxicillin-clavulanate (Augmentin 875 mg-125 mg oral tablet) 875 mg Oral (given by mouth) every 12 hours.  Last Dose:____________________  Medications that have not changed  Other Medications  lisinopril 5 Milligram Oral (given by mouth) every day.  Last Dose:____________________  loratadine (Claritin 10 mg oral tablet) 1 Tabs Oral (given by mouth) once a day (in the evening) as needed as needed for allergy symptoms.  Last Dose:____________________         Patient???s Final Home Medication List Upon Discharge:          A Patient Specific Refrigerated Medication Kit-Combination Routes every 5 minutes as needed other (see comment).  amoxicillin-clavulanate (Augmentin 875 mg-125 mg oral tablet) 875 mg Oral (given by mouth) every 12 hours.  lisinopril 5 Milligram Oral (given by mouth) every day.  loratadine (Claritin 10 mg oral tablet) 1 Tabs Oral (given by  mouth) once a day (in the evening) as needed as needed for allergy symptoms.         Comment:       ORDERS         Order Name Order Details   Discharge Patient 08/31/16 11:09:00 EDT, Discharge Home/Self Care

## 2016-08-31 NOTE — Op Note (Signed)
Operative Report    [facility] Staff., MD  Service Date: 08/31/2016    PREOPERATIVE DIAGNOSIS:  Deformity of reconstructed breast.      POSTOPERATIVE DIAGNOSIS:  Deformity of reconstructed breast.      PROCEDURE:  1.  Exchange tissue expanders for permanent implants and reconstructed  breast.  2.  Fat grafting to lips.      DESCRIPTION OF PROCEDURE:  The patient was carefully marked for the  planned procedure while in standing position.  She was then taken to  the operating room, placed on table in supine position.  General  anesthesia was induced.  Anterior torso was prepped and draped in the  usual sterile fashion.  The lateral aspect of the transverse  inframammary incision was opened on each side.  Electrocautery was  used to extend this downward.  The AlloDerm pocket was entered for a  distance of several centimeters.  An intact tissue expander on each  side was evacuated and removed.  Several sizers were placed within  each pocket.  It was the consensus of the entire operating team that  the 415 mL smooth round gel full profile implant produced the best  combination of size and naturalness of the available implants.  The  AlloDerm was noted to be incompletely vascularized and incorporated,  more so on the left side than the right side, and it was felt that it  would be best to defer fat grafting to a later stage to give the graft  additional time to incorporate.  The defect in each pocket was closed  using 2-0 Vicryl sutures.  The subcutaneous tissue was closed using  2-0 Vicryl sutures.  The skin was closed using running 4-0  subcuticular Monocryl suture.      Next, tumescent fluid, totaling 120 mL was introduced into the  subcutaneous tissue of the anterior abdominal wall, fat was harvested  using a syringe and a long tapered facial injection cannula to keep  the particle size as small as possible.  A total of 35 mL of aspirate  was removed.  The stab incisions were closed using Monocryl  suture.   This was then strained to yield a total of 12 mL of fat graft.  Stab  incisions were made at either lateral commissure of the lips.  Fat was  then introduced using the [facility] tapered facial injection cannula  into the tissue of the upper and lower lips making many passes and  placing the fat in different tissue planes.  Approximately 4 mL was  placed in the upper lip and 6 mL in the lower lip.  The stab incisions  were closed using absorbable suture.  Dermabond was applied to all  suture lines except the lips.  The patient was then awakened and taken  to recovery in good condition.      Nicanor Bake., MD  TR: *n DD: 08/31/2016 11:09 TD: 08/31/2016 11:40 Job#: [id number]  \\[id number]\\DOC#: [id number]  \\[id number]\\  Signature Line    Electronically Signed on 10/03/2016 09:42 AM EDT  ________________________________________________  [facility]

## 2016-11-27 NOTE — Nursing Note (Signed)
Medication Administration Follow Up-Text       Medication Administration Follow Up Entered On:  11/27/2016 14:38 EDT    Performed On:  11/27/2016 14:37 EDT by Adela Lank, RN, PAMELA      Intervention Information:     acetaminophen  Performed by Adela Lank, RN, PAMELA on 11/27/2016 14:09:00 EDT       acetaminophen,650mg   Oral,mild pain (1-3)       Med Response   ED Medication Response :   Symptoms improved   Numeric Rating Pain Scale :   2   Pasero Opioid Induced Sedation Scale :   1 = Awake and alert   Respiratory Rate :   16 br/min   FLOYD, RN, PAMELA - 11/27/2016 14:37 EDT

## 2016-11-27 NOTE — Nursing Note (Signed)
Medication Administration Follow Up-Text       Medication Administration Follow Up Entered On:  11/27/2016 12:33 EDT    Performed On:  11/27/2016 12:48 EDT by Milagros Evener, RN, Foy [place]      Intervention Information:     hydromorphone  Performed by Milagros Evener, RN, SUZANNE R on 11/27/2016 12:33:00 EDT       hydromorphone,0.5mg   IV Push,Hand, Right,other (see comment)       Med Response   ED Medication Response :   Continue to observe for symptoms   Milagros Evener RNFoy [place] - 11/27/2016 12:33 EDT

## 2016-11-27 NOTE — Op Note (Signed)
Operative Report    [facility]. Marvene Staff., MD  Service Date: 11/27/2016    PREOPERATIVE DIAGNOSIS:  Breast deformity, status post bilateral  mastectomies.    POSTOPERATIVE DIAGNOSIS:  Breast deformity, status post bilateral  mastectomies.    PROCEDURE:  Fat grafting to breast mounds and revision left breast  mound.    DESCRIPTION OF PROCEDURE:  The patient was carefully marked for the  planned procedure prior to surgery.  She was then taken to the  operating room.  General anesthesia was induced.  She was sequentially  prepped and draped in the right lateral and left lateral then supine  positions.  Tumescent fluid was introduced into the donor areas of the  flanks, lateral thighs, and inner thighs at the appropriate time.  A  total of 500 mL of aspirate was removed using a 3 mm multi-hole  reciprocating cannula.  This was washed and strained to yield  approximately 140 mL of purified fat graft.  This was kept for  injection.    An incision was made in the left inframammary crease, which was noted  to be significantly above the right inframammary crease.  A crescent  of skin was excised.  Meticulous hemostasis was achieved with the  patient's blood pressure elevated to just above preoperative levels.   The subcutaneous tissues were secured to the chest wall in the desired  position using interrupted 2-0 Vicryl suture.  The skin was closed  using running 4-0 subcuticular Monocryl suture followed by Dermabond.   Total length of the suture line was 11 cm.    With the patient in the semi-sitting position intraoperatively, fat  was placed into the areas of tissue deficiency at the upper poles of  both breast mounds in the inferomedial aspect of the left breast  mound.  This was accomplished using 10 mL syringes and the Coleman  long tapered facial injection cannulas.  Multiple stab incisions were  used on each breast.  A total of 70 mL was placed in the right breast  and 60 mL in the left breast.   Following this, at the patient's  preoperative request, approximately 7 mL of fat were placed into the  upper and lower lips combined by a right sterilely made, commisural  stab incision.  All stab incisions were closed using Monocryl suture  followed by Dermabond except for the commisural incision.  The suture  line of the left breast was reinforced using Dermabond.  A brassiere  was then placed with the left side padded underneath to provide  support for the new suture line.  The patient was then awakened and  taken to recovery in good condition.      Nicanor Bake., MD  TR: pn DD: 11/27/2016 12:16 TD: 11/27/2016 13:22 Job#: [id number]  \\[id number]\\DOC#: [id number]  \\[id number]\\  Signature Line    Electronically Signed on 11/27/2016 02:37 PM EDT  ________________________________________________  [facility]

## 2016-11-27 NOTE — Nursing Note (Signed)
Adult Patient History Form-Text       Adult Patient History Entered On:  11/27/2016 14:00 EDT    Performed On:  11/27/2016 13:59 EDT by Adela Lank, RN, PAMELA               General Info   Patient Identified :   [name] [name]   Preferred Mode of Communication :   Verbal   Pregnancy Status :   Patient denies   FLOYD, RN, PAMELA - 11/27/2016 13:59 EDT   Immunizations   Influenza Vaccine Status :   Non-influenza season (before Oct 1st and after Mar 31st)   Last Tetanus :   Unknown   FLOYD, RN, PAMELA - 11/27/2016 13:59 EDT   ID Risk Screen   Chills :   No   Cough (Any Duration) :   No   Fever :   No   Hemoptysis (Blood in Sputum) :   No   Night Sweats :   No   Weight Loss Greater Than 10 Pounds :   No   Hx of TB Now or at Any Time In the Past (Even if on Meds) :   No   Foreign-Born :   No   Homeless or In Shelter :   No   Incarcerated Within Last 2 Years :   No   Intravenous Drug User :   No   Female Homosexual :   No   New TST/IGRA Results Pos(Within 2 yrs),Hx Recent TB Exposure :   No   FLOYD, RN, PAMELA - 11/27/2016 13:59 EDT   3 or more loose/watery stools :   No   MRSA/VRE Screening :   None of these apply   Patient Recent Travel History :   No recent travel   Family Member Travel History :   No recent travel   FLOYD, RN, PAMELA - 11/27/2016 13:59 EDT   Infectious Disease Risk Factor Grid   Chills :   No   Fever :   No   Fatigue :   No   Headache :   No   Runny or Stuffy Nose :   No   Sore Throat :   No   Difficulty Breathing :   No   Shortness of Breath :   No   New or Worsening Cough :   No   Wheezing :   No   Vomiting :   No   Diarrhea :   No   Abdominal (Stomach Pain) :   No   Muscle Pain :   No   Weakness/Numbness :   No   Recent Exposure to Communicable Disease :   No   Illness With Generalized Rash :   No   Abnormal Bleeding :   No   Unexplained Hemorrhage (Bleeding or Bruising) :   No   Arthralgia :   No   Conjunctivitis :   No   FLOYD, RN, PAMELA - 11/27/2016 13:59 EDT   Bloodless Medicine   Will Patient Accept Blood  Transfusion and/or Blood Products :   Yes   Adela Lank RN, PAMELA - 11/27/2016 13:59 EDT   Functional   Sensory Deficits :   None   FLOYD, RN, PAMELA - 11/27/2016 13:59 EDT   Social History   Social History   (As Of: 11/27/2016 14:00:34 EDT)   Tobacco:        Never smoker   (Last Updated: 08/25/2016 10:32:36 EDT by Melida Quitter, RN, Orson Ape)  Alcohol:        Current, Beer, Liquor, 3-5 times per week   (Last Updated: 08/25/2016 10:32:54 EDT by Melida Quitter, RN, Orson Ape)          Substance Abuse:        Denies   (Last Updated: 08/25/2016 10:33:00 EDT by Melida Quitter, RN, Orson Ape)            Spiritual   Faith/Denomination :   Non-denominational   FLOYD, RN, PAMELA - 11/27/2016 13:59 EDT   Harm Screen   Injuries/Abuse/Neglect in Household :   Denies   Feels Unsafe at Home :   No   Suicidal Behavior :   None   Self Harming Behavior :   None   Suicidal Ideation :   None   FLOYD, RN, PAMELA - 11/27/2016 13:59 EDT   Advance Directive   Advance Directive :   No   FLOYD, RN, PAMELA - 11/27/2016 13:59 EDT   Admission Complete   Admission Complete :   Yes   FLOYD, RN, PAMELA - 11/27/2016 13:59 EDT

## 2016-11-27 NOTE — Nursing Note (Signed)
Medication Administration Follow Up-Text       Medication Administration Follow Up Entered On:  11/27/2016 18:21 EDT    Performed On:  11/27/2016 18:21 EDT by Adela Lank, RN, PAMELA      Intervention Information:     acetaminophen  Performed by Adela Lank, RN, PAMELA on 11/27/2016 17:51:00 EDT       acetaminophen,650mg   Oral,mild pain (1-3)       Med Response   ED Medication Response :   Symptoms improved   FLOYD, RN, PAMELA - 11/27/2016 18:21 EDT

## 2016-11-27 NOTE — Procedures (Signed)
IntraOp Record - [facility]             IntraOp Record - SFOR Summary                                                                   Primary Physician:        Bronson Ing                              [facility]    Case Number:              [id number]    Finalized Date/Time:      11/27/16 12:20:29    Pt. Name:                 [name] [name], [name] [name]    D.O.B./Sex:               [birth year]    Female    Med Rec #:                [id number]    Physician:                Bronson Ing                              [facility]    Financial #:              [id number]    Pt. Type:                 O    Room/Bed:                 /    Admit/Disch:              11/27/16 06:29:00 -    Institution:       [facility] - Case Times                                                                                                         Entry 1                                                                                                          Patient      In Room Time  11/27/16 08:12:00               Out Room Time                   11/27/16 12:08:00    Anesthesia     Procedure      Start Time               11/27/16 08:52:00               Stop Time                       11/27/16 12:00:00    Last Modified By:         Maxwell Caul RN, Otto Herb                              11/27/16 12:15:03      SFOR - Case Times Audit                                                                          11/27/16 12:15:03         Owner: Carin Hock                               Modifier: Carin Hock                                                        <+> 1         Out Room Time        <+> 1         Stop Time        SFOR - Case Attendance                                                                                                    Entry 1                         Entry 2                         Entry 3                                          Case Attendee             Bronson Ing  Maxwell Caul, RN, Ricard Dillon                               [facility]    Role Performed            Surgeon Primary                 Circulator                      Surgical Scrub    Time In                   11/27/16 08:12:00               11/27/16 08:12:00               11/27/16 08:12:00    Time Out                  11/27/16 12:08:00               11/27/16 12:08:00               11/27/16 12:08:00    Procedure                 Breast Revision with            Breast Revision with            Breast Revision with                              Fat Transfer(Bilateral)         Fat Transfer(Bilateral)         Fat Transfer(Bilateral)    Last Modified By:         Maxwell Caul, RN, Rodney Cruise, RN, Rodney Cruise, RN, Otto Herb                              11/27/16 12:20:23               11/27/16 12:20:23               11/27/16 12:20:23                                Entry 4                         Entry 5                                                                          Case Attendee             MITCHELL-CRNA,                  LUKE-MD,  JOSHUA EUGENE  JENNIFER B    Role Performed            CRNA                            Anesthesiologist    Time In                   11/27/16 08:12:00               11/27/16 08:12:00    Time Out                  11/27/16 12:08:00               11/27/16 12:08:00    Procedure                 Breast Revision with            Breast Revision with                              Fat Transfer(Bilateral)         Fat Transfer(Bilateral)    Last Modified By:         Maxwell Caul, RN, Rodney Cruise, RN, KRISTIN Y                              11/27/16 12:20:23               11/27/16 12:20:23    General Comments:            [name] MS4 OBSERVER      SFOR - Case Attendance Audit                                                                     11/27/16 12:20:23         Owner: Carin Hock                               Modifier: Carin Hock                                                            1      <+> Time Out            1     <*> Procedure                              Breast Revision with Fat Transfer(Bilateral)            2     <+> Time Out            2     <*> Procedure  Breast Revision with Fat Transfer(Bilateral)            3     <+> Time Out            3     <*> Procedure                              Breast Revision with Fat Transfer(Bilateral)            4     <+> Time Out            4     <*> Procedure                              Breast Revision with Fat Transfer(Bilateral)            5     <+> Time Out            5     <*> Procedure                              Breast Revision with Fat Transfer(Bilateral)     11/27/16 09:56:11         Owner: Carin Hock                               Modifier: [email or web address]                                                            1     <*> Procedure            1     <*> Procedure                              Breast Revision with Fat Transfer(Bilateral)            1     <*> Procedure                              Breast Revision with Fat Transfer(Bilateral)            2     <*> Time In            2     <*> Procedure            2     <*> Procedure                              Breast Revision with Fat Transfer(Bilateral)            2     <*> Procedure                              Breast Revision with Fat Transfer(Bilateral)            3     <*> Case Attendee  TISDALE,  DANA K            3     <*> Case Attendee                          FLINNER-CRNA,  DANA WEEKS            3     <*> Case Attendee                          FLINNER-CRNA,  DANA WEEKS            3     <*> Time In            3     <*> Procedure            3     <*> Procedure                              Breast Revision with Fat Transfer(Bilateral)            3     <*> Procedure                              Breast Revision with Fat Transfer(Bilateral)            4     <*> Time In            4     <*> Procedure            4     <*> Procedure                               Breast Revision with Fat Transfer(Bilateral)            4     <*> Procedure                              Breast Revision with Fat Transfer(Bilateral)            5     <*> Time In            5     <*> Procedure            5     <*> Procedure                              Breast Revision with Fat Transfer(Bilateral)            5     <*> Procedure                              Breast Revision with Fat Transfer(Bilateral)     11/27/16 09:19:35         Owner: [email or web address]                               Modifier: Carin Hock  1     <*> Time In            1     <*> Time In            1     <*> Procedure            1     <*> Procedure                              Breast Revision with Fat Transfer(Bilateral)            1     <*> Procedure                              Breast Revision with Fat Transfer(Bilateral)            1     <*> Procedure                              Breast Revision with Fat Transfer(Bilateral)            2     <*> Case Attendee                          Maxwell Caul, RN, KRISTIN Y            2     <*> Case Attendee                          Maxwell Caul, RN, KRISTIN Y            2     <*> Role Performed            2     <*> Role Performed            2     <*> Procedure            3     <*> Case Attendee                          FLINNER-CRNA,  DANA WEEKS            3     <*> Role Performed            3     <*> Role Performed            3     <*> Procedure            4     <*> Case Attendee                          MITCHELL-CRNA,  JENNIFER B            4     <*> Case Attendee                          MITCHELL-CRNA,  JENNIFER B            4     <*> Role Performed            4     <*> Role Performed  4     <*> Procedure            5     <*> Case Attendee                          LUKE-MD,  JOSHUA EUGENE            5     <*> Case Attendee                          LUKE-MD,  JOSHUA EUGENE            5     <*> Role Performed            5     <*> Role Performed            5      <*> Procedure        SFOR - Skin Assessment                                                                          Pre-Care Text:            A.240 Assesses baseline skin condition Im.120 Implements protective measures to prevent skin or tissue injury           due to mechanical sources  Im.280.1 Implements progective measures to prevent skin or tissue injury due to           thermal sources Im.360 Monitors for signs and symptons of infection                              Entry 1                                                                                                          Skin Integrity            Intact    Last Modified By:         Maxwell Caul, RN, KRISTIN Y                              11/27/16 09:19:40    Post-Care Text:            E.10 Evaluates for signs and symptoms of physical injury to skin and tissue E.270 Evaluate tissue perfusion           O.60 Patient is free from signs and symptoms of injury caused by extraneous objects   O.210 Patinet's tissue           perfusion is consistent with or improved from baseline levels  [facility] - Patient Positioning                                                                      Pre-Care Text:            A.240 Assesses baseline skin condition A.280 Identifies baseline musculoskeletal status A.280.1 Identifies           physical alterations that require additional precautions for procedure-specific positioning A.510.8 Maintains           patient's dignity and privacy Im.120 Implements protective measures to prevent skin/tissue injury due to           mechanical sources Im.40 Positions the patient Im.80 Applies safety devices                              Entry 1                                                                                                          Procedure                 Breast Revision with            Body Position                   Other/See comments                              Fat Transfer(Bilateral)    Feet Uncrossed            No                               Pressure Points                 No                                                              Checked     Additional                MULTIPLE POSITIONS FOR          Positioning Device              Bean Bag, Axillary    Information               PROCEDURE:PT POSITIONED  Roll, Foam Padding,                              LATERAL RIGHT SIDE UP                                           Safety Strap, Pillow                              ON BEAN BAG, AXILLARY                              ROLL,LEFT ARM ON                              PADDED EGGCRATE ON                              ARMBOARD, RIGHT ARM                              SUSPENDED ON PILLOW                              WITH EGGCRATE, PILLOWS                              X2 BETWEEN LEGS,                              EGGCRATE UNDER LOWER                              LEG (SEE ADTL NOTES)    Positioned By             Bronson Ing              Outcome Met (O.80)              Yes                              Onalee Hua, RN,                              Otto Herb,                              MITCHELL-CRNA,                              JENNIFER B    Last Modified By:         Maxwell Caul, RN, KRISTIN Y                              11/27/16 09:54:40    Post-Care Text:  E.10 Evaluates for signs and symptoms of physical injury to skin and tissue E.290 Evaluates musculoskeletal           status O.80 Patient is free from signs and symptoms of injury related to positioning O.120 the patient is free           from signs and symptoms of injury related to transfer/transport  O.250 Patient's musculoskeletal status is           maintained at or improved from baseline levels    General Comments:            ADDTL POSITIONING NOTES: EGGCRATE UNDER LEFT FOOT, SAFETY BELT SECURED OVER THIGHS. PT REPOSITIONED ON BEAN BAG           LATERAL LEFT SIDE UP, HEAD ON PILLOW, AXILLARY ROLL IN PLACE, RIGHT ARM EXTENED ON  EGGCRATE PADDED ARMBOARD,           LEFT ARM SUPPORTED ON PILLOW ACROSS CHEST, PILLOWS X2 BETWEEN LEGS, EGGCRATE UNDER LOWER RIGHT LEG AND FOOT,           SAFETY BELT PADDED AND SECURED OVER THIGHS.  PT REPOSITIONED TO SUPINE, PILLOW UNDER HEAD, ARMS PADDED AND           SECURED IN FOAM CRADLES LESS THAN 90 DEGREES EXTENSION ON ARMBOARDS, LEGS SLIGHTLY FROGLEGGED WITH EGGCRATE           PADDING UNDER ANKLES AND FEET TO SUPPORT, SAFETY BELT PADDED AND SECURED OVER LOWER LEGS (PT PREPPED KNEES TO           NECK).      SFOR - Skin Prep                                                                                Pre-Care Text:            A.30 Verifies allergies A.20 Verifies procedure, surgical site, and laterality A.510.8 Maintains paritnet's           dignity and privacy Im.270 Performs Skin Preparation Im.270.1 Implements protective measures to prevent skin           and tissue injury due to chemical sources  A.300.1 Protects from cross-contamination                              Entry 1                         Entry 2                                                                          Hair Removal      Hair Removal By      Hair Removal Methods      Hair Removal Site      Hair Removal Site  Details     Skin Prep      Prep Agents (Im.270)     Chlorhexidine Gluconate         Chlorhexidine Gluconate                              2% w/Alcohol                    2% w/Alcohol     Prep Area (Im.270)       Chest, Abdomen, Flank,          Chest, Thigh,                              Back                            Abdomen/Mons pubis,                                                              Abdomen     Prep Area Details        Bilateral     Prep By                  Maxwell Caul, RN, Rodney Cruise, RN, KRISTIN Y    Outcome Met (O.100)       Yes                             Yes    Last Modified ByMaxwell Caul, RN, Rodney Cruise, RN, Otto Herb                              11/27/16 09:55:22                11/27/16 11:03:24    Post-Care Text:            E.10 Evaluates for signs and symptoms of physical injury to skin and tissue O.100 Patient is free from signs           and symptoms of chemical injury  O.740 The patient's right to privacy is maintained      [facility] - Skin Prep Audit                                                                           11/27/16 11:03:24         Owner: Carin Hock                               Modifier: Carin Hock                                                        <+>  2         Prep Area (Im.270)        <+> 2         Prep Agents (Im.270)        <+> 2         Prep By        <+> 2         Outcome Met (O.100)        SFOR - Counts Initial and Final                                                                 Pre-Care Text:            A.20 Verifies operative procedure, sugical site, and laterality A.20.2 Assesses the risk for unintended           retained foreign body Im.20 Performs required counts                              Entry 1                                                                                                          Initial Counts      Initial Counts           Maxwell Caul, RN, KRISTIN Y,          Items included in               Sponges, Sharps     Performed By             Bobette Mo K                the Initial Count     Final Counts      Final Counts             Maxwell Caul, RN, Otto Herb,          Final Count Status              Correct     Performed By             Bobette Mo K     Items Included in        Sponges, Sharps     Final Count     Outcome Met (O.20)        Yes    Last Modified By:         Maxwell Caul, RN, KRISTIN Y                              11/27/16 12:16:55    Post-Care Text:            E.50 Evaluates  results of the surgical count O.20 Patient is free from unintended retained foreign objects      [facility] - Counts Initial and Final Audit                                                            11/27/16 12:16:55         Owner: Carin Hock                                Modifier: Carin Hock                                                        <+> 1         Final Counts Performed By        <+> 1         Final Count Status        <+> 1         Items Included in Final Count        <+> 1         Outcome Met (O.20)     11/27/16 09:56:38         Owner: Carin Hock                               Modifier: Carin Hock                                                            1     <*> Initial Counts Performed By            Maxwell Caul, RN, KRISTIN Y            1     <+> Items included in the Initial Count        SFOR - Counts Additional                                                                        Pre-Care Text:            A.20 Verifies operative procedure, sugical site, and laterality A.20.2 Assesses the risk for unintended           retained foreign body Im.20 Performs required counts                              Entry 1                         Entry 2  Additional Count          Closing Count                   Closing Count    Type     Additional Count          Maxwell Caul, RN, Rodney Cruise, RN, Otto Herb,    Participants              TISDALE,  DANA Lowella Dell,  DANA K    Count Status              Correct                         Correct    Items Counted             Sponges, Sharps                 Sponges, Sharps    Outcome Met (O.20)        Yes                             Yes    Last Modified ByMaxwell Caul, RN, Rodney Cruise, RN, Otto Herb                              11/27/16 09:56:57               11/27/16 11:03:52    Post-Care Text:            E.50 Evaluates results of the surgical count O.20 Patient is free from unintended retained foreign objects      SFOR - Counts Additional Audit                                                                   11/27/16 11:03:52         Owner: Carin Hock                               Modifier: [email or web address]                                                         <+> 2         Additional Count Type        <+> 2         Additional Count Participants        <+> 2         Count Status        <+> 2         Items Counted        <+> 2  Outcome Met (O.20)        SFOR - General Case Data                                                                        Pre-Care Text:            A.350.1 Classifies surgical wound                              Entry 1                                                                                                          Case Information      ASA Class                2                               Case Level                      Level 3     OR                       SF 05                           Specialty                       Plastic (SN)     Wound Class              1-Clean    Preop Diagnosis           N65.0                           Postop Diagnosis                N65.0    Last Modified By:         Maxwell Caul, RN, KRISTIN Y                              11/27/16 09:57:17    Post-Care Text:            O.760 Patient receives consistent and comparable care regardless of the setting      SFOR - Fire Risk Assessment  Entry 1                                                                                                          Fire Risk                 Alcohol Based Prep              Fire Risk Score                 2    Assessment: If            Solution, Ignition    checked, checkmark        Source In Use    = 1 point     Last Modified By:         Maxwell Caul, RN, Otto Herb                              11/27/16 09:57:28      SFOR - Time Out                                                                                 Pre-Care Text:            A.10 Confirms patient identity A.20 Verifies operative procedure, surgical site, and laterality A.20.1 Verifies           consent for planned procedure A.30 Verifies allergies                               Entry 1                                                                                                          Procedure                 Breast Revision with            Is everyone ready               Yes                              Fat Transfer(Bilateral)  to perform time out     Have all members of       Yes                             Patient name and                Yes    the surgical team                                         DOB confirmed     been introduced     Allergies discussed       Yes                             Surgical procedure              Yes                                                              to be performed                                                               confirmed and                                                               verified by                                                               completed surgical                                                               consent     Correct surgical          Yes                             Correct laterality              Yes    site marked and  confirmed     initials are     visible through     prepped and draped     field (or     alternative ID band     used)     Correct patient           Yes                             Surgeon shares                  Yes    position confirmed                                        operative plan,                                                               possible                                                               difficulties,                                                               expected duration,                                                               anticipated blood                                                               loss and reviews                                                               all                                                               critical/specific  concerns     Required blood            Yes                             Essential imaging               Yes    products, implants,                                       available and fetal     devices and/or                                            heartones confirmed     special equipment                                         (if applicable)     available and     sterility confirmed     VTE prophylaxis           Yes                             Antibiotics ordered             Yes    addressed                                                 and administered     Anesthesia shares         Yes                             Fire risk                       Yes    anesthetic plan and                                       assessment scored     reviews patient                                           and plan discussed     specific concerns     Appropriate drying        Yes                             Surgeon states:                 Yes    time for prep  Does anyone have     observed before                                           any concerns? If     draping                                                   you see, suspect,                                                               or feel that                                                               patient care is                                                               being compromised,                                                               speak up for                                                               patient safety     Time Out Complete         11/27/16 08:47:00    Last Modified By:         Maxwell Caul RN, KRISTIN Y                              11/27/16 09:58:00    Post-Care Text:            E.30 Evaluates verification process for correct patient, site, side, and level surgery      [facility] - Debrief  Pre-Care Text:            Im.330 Manages specimen handling and disposition                              Entry 1                                                                                                          Procedure                 Breast Revision with            Final counts                    Yes                              Fat Transfer(Bilateral)         correct and                                                               verbally verified                                                               with                                                               surgeon/licensed                                                               independent                                                               practitioner (if  applicable)     Actual procedure          Yes                             Postop diagnosis                Yes    performed confirmed                                       confirmed     Wound                     Yes                             Confirm specimens               Yes    classification                                            and specimens     confirmed                                                 labeled                                                               appropriately (if                                                               applicable)     Foley catheter            Yes                             Patient recovery                Yes    removed (if                                               plan confirmed     applicable)     Debrief Complete          11/27/16 11:55:00    Last Modified By:         Maxwell Caul, RN, KRISTIN Y                              11/27/16 12:14:21    Post-Care  Text:            E.800 Ensures continuity of care E.50 Evaluates results of the surgical count O.30 Patient's procedure is           performed on the correct  site, side, and level O.50 patient's current status is communicated throughout the           continuum of care O.40 Patient's specimen(s) is managed in the appropriate manner      S[facility] - Cautery                                                                                  Pre-Care Text:            A.240 Assesses baseline skin condition A280.1 Identifies baseline musculoskeletal status Im.50 Implements           protective measures to prevent injury due to electrical sources  Im.60 Uses supplies and equipment within safe           parameters Im.80 Applies safety devices                              Entry 1                                                                                                          ESU Type                  GENERATOR                       Identification                  [id number]                              COVIDIEN/[facility]              Number     Coag Setting (watts)      35                              Cut Setting (watts)             35    Grounding Pad             Yes                             Grounding Pad Site              Thigh, left    Needed?  Grounding Kirt Boys, RN, KRISTIN Y           Outcome Met (O.10)              Yes    Applied By     Last Modified By:         Maxwell Caul, RN, Otto Herb                              11/27/16 09:59:23    Post-Care Text:            E.10 Evaluates for signs and symptoms of physical injury to skin and tissue O.10 Patient is free from signs and           symptoms of injury related to thermal sources  O.70 Patient is free from signs and symptoms of electrical injury      S[facility] - Patient Care Devices                                                                     Pre-Care Text:            A.200 Assesses risk for normothermia regulation A.40 Verifies presence of prosthetics or corrective devices           Im.280 Implements thermoregulation measures Im.60 Uses supplies and equipment within safe parameters                               Entry 1                                                                                                          Equipment Type            MACHINE SEQUENTIAL              SCD Sleeve Site                 Legs Bilateral                              COMPRESSION    Equipment/Tag Number      [id number]                          Initiated Pre                   Yes  Induction     Last Modified By:         Maxwell Caul, RN, KRISTIN Y                              11/27/16 09:59:37    Post-Care Text:            E.10 Evaluates signs and symptoms of physical injury to skin and tissue O.60 Patient is free from signs and           symptoms of injury caused by extraneous objects      S[facility] - Medications                                                                              Pre-Care Text:            A.10 Confirms patient identity A.30 Verifies allergies Im.220 Administers prescribed medications Im.220.2           Administers prescribed antibiotic therapy as ordered                              Entry 1                         Entry 2                         Entry 3                                          Time Administered         11/27/16 11:24:00               11/27/16 11:24:00               11/27/16 11:24:00    Medication                LIDOCAINE 1% W/EPI         LIDOCAINE 1% W/EPI         LIDOCAINE 1% W/EPI                              VIAL                            VIAL                            VIAL    Route of Admin            Local Injection                 Local Injection                 Local Injection    Dose/Volume                                                             (include amount and     unit of measure)     Site                      Flank                           Flank                           Breast    Site Detail               Left                            Right                           Left    Administered  By           Delorise Jackson,  RICHARD              KLINE-MD,  RICHARD                              [facility]                          [facility]                          MAHLON    Outcome Met (O.130)       Yes                             Yes                             Yes    Last Modified ByMaxwell Caul, RN, Rodney Cruise, RN, Rodney Cruise, RN, KRISTIN Y                              11/27/16 11:24:38               11/27/16 11:25:00               11/27/16 11:24:38                                Entry 4                         Entry 5  Time Administered     Medication                LIDOCAINE 1% W/EPI         TUMESCENT SOLUTION                              VIAL    Route of Admin            Local Injection                 Subcutaneous    Dose/Volume                                           TOTAL TO FLANK,    (include amount and                                       HIPS, THIGHS (AREAS OF    unit of measure)                                          LIPO)    Site                      Breast                          Flank    Site Detail               Right                           Bilateral    Administered By           Legrand Rams                              [facility]                          MAHLON    Outcome Met (O.130)       Yes                             Yes    Last Modified ByMaxwell Caul, RN, Rodney Cruise, RN, KRISTIN Y                              11/27/16 12:18:42               11/27/16 12:20:06    Post-Care Text:            E.20 Evaluates response to medications O.130 Patient receives appropriately administerd medication(s)      SFOR - Medications Audit  11/27/16 12:20:06         Owner: Carin Hock                               Modifier: Carin Hock                                                         <+> 5         Medication        <+> 5         Route of Admin        <+> 5         Administered By        <+> 5         Dose/Volume (include amount and                      unit of measure)        <+> 5         Outcome Met (O.130)        <+> 5         Site        <+> 5         Site Detail     11/27/16 12:18:42         Owner: Carin Hock                               Modifier: [email or web address]                                                        <+> 4         Medication        <+> 4         Route of Admin        <+> 4         Administered By        <+> 4         Dose/Volume (include amount and                      unit of measure)        <+> 4         Outcome Met (O.130)        <+> 4         Site        <+> 4         Site Detail     11/27/16 11:25:00         Owner: [email or web address]                               Modifier: Carin Hock  2     <*> Medication                             LIDOCAINE 1% W/EPI VIAL            2     <+> Outcome Met (O.130)     11/27/16 11:24:38         Owner: [email or web address]                               Modifier: Carin Hock                                                            1     <*> Medication                             LIDOCAINE 1% W/EPI VIAL            1     <+> Time Administered            2     <*> Medication                             LIDOCAINE 1% W/EPI VIAL            2     <+> Time Administered            3     <*> Medication                             LIDOCAINE 1% W/EPI VIAL            3     <+> Time Administered     11/27/16 11:05:59         Owner: Carin Hock                               Modifier: [email or web address]                                                            1     <*> Medication                             LIDOCAINE 1% W/EPI VIAL            1     <+> Site Detail        <+> 3         Medication        <+> 3         Route of Admin        <+> 3         Administered By        <+> 3  Dose/Volume (include amount and                      unit of measure)        <+> 3         Outcome Met (O.130)        <+> 3         Site        <+> 3         Site Detail        SFOR - Communication                                                                            Pre-Care Text:            A.520 Identifies barriers to communication (Patient and Family Communications) A.20 Verifies operative           procedure, surgical site, and laterality Sports coach) Im.500 Provides status reports to family           members Im.150 Develops individualized plan of care                              Entry 1                                                                                                          Communication             Phone Call                      Communication By                Maxwell Caul, RN, Otto Herb    Date and Time             11/27/16 09:35:00               Communication To                FAMILY    Last Modified By:         Maxwell Caul RN, Otto Herb                              11/27/16 10:59:39    Post-Care Text:            E.520 Evaluates psychosocial response to plan of care O.500 Patient or designated support person demonstrates           knowledge of the expected psychosocial responses to the procedure E.800 Ensures continuity of care O.50           Patient's current status is communicated  throughout the continuum of care      [facility] - Dressing/Packing                                                                         Pre-Care Text:            A.350 Assesses susceptibility for infection Im.250 Administers care to invasive devices Im.290 Administer care           to wound sites  Im.300 Implements aseptic technique                              Entry 1                         Entry 2                                                                          Site                      Breast                          Flank    Site Details              Bilateral                        Bilateral    Dressing Item     Details      Packing (Im.290)      Dressing Item            Fluffs, Liquid Bandage          Liquid Bandage     (Im.290)      Miscellaneous            Post Surgical Bra     (Im.290)      Cast/Splint (Im.290)     Last Modified By:         Maxwell Caul, RN, Rodney Cruise, RN, KRISTIN Y                              11/27/16 12:16:29               11/27/16 12:16:29    Post-Care Text:            E.320 Evaluate factors associted with increased risk for postoperative infection at the completion of the           procedure O.200 Patient's wound perfusion is consistent with or improved from baseline levels  O.Patient is           free from signs and symptoms of infection      SFOR - Procedures  Pre-Care Text:            A.20 Verifies operative procedure, surgical site, and laterality Im.150 Develops individualized plan of care                              Entry 1                                                                                                          Procedure     Description      Procedure                Breast Revision with            Modifiers                       Bilateral                              Fat Transfer     Surgical Procedure       FAT GRAFTING TO     Text                     BILATERAL BREAST AND                              FAT GRAFT TO LIPS    Primary Procedure         Yes                             Primary Surgeon                 Bronson Ing                                                                                              [facility]    Start                     11/27/16 08:52:00               Stop                            11/27/16 12:00:00    Anesthesia Type           General  Surgical Service                Plastic (SN)    Wound Class               1-Clean    Last Modified By:         Maxwell Caul, RN, Otto Herb                               11/27/16 12:16:36    Post-Care Text:            O.730 The patient's care is consistent with the individualized perioperative plan of care      SFOR - Procedures Audit                                                                          11/27/16 12:16:36         Owner: Carin Hock                               Modifier: [facility]                                                        <+> 1         Stop     11/27/16 12:14:52         Owner: Carin Hock                               Modifier: Carin Hock                                                            1     <*> Procedure                              Breast Revision with Fat Transfer            1     <*> Surgical Procedure Text                FAT GRAFTING TO BILATERAL BREAST        [facility] - Transfer  Entry 1                                                                                                          Transferred By            MITCHELL-CRNA,                  Via                             Bed                              Darlin Drop, RN,                              Otto Herb    Post-op Destination       PACU    Skin Assessment      Condition                Intact    Last Modified By:         Maxwell Caul, RN, Belenda Cruise Y                              11/27/16 12:14:29      Case Comments                                                                                         <None>              Finalized By: Maxwell Caul RN, KRISTIN Y      Document Signatures                                                                             Signed By:           Maxwell Caul RN, KRISTIN Y 11/27/16 12:20

## 2016-11-27 NOTE — Nursing Note (Signed)
Medication Administration Follow Up-Text       Medication Administration Follow Up Entered On:  11/27/2016 12:33 EDT    Performed On:  11/27/2016 12:48 EDT by Milagros Evener, RN, Foy [place]      Intervention Information:     lorazepam  Performed by Milagros Evener RN, SUZANNE R on 11/27/2016 12:33:00 EDT       lorazepam,0.5mg   IV Push,Hand, Right,anxiety       Med Response   ED Medication Response :   Continue to observe for symptoms   Milagros Evener RNFoy [place] - 11/27/2016 12:33 EDT

## 2016-11-28 NOTE — Nursing Note (Signed)
Medication Administration Follow Up-Text       Medication Administration Follow Up Entered On:  11/28/2016 8:42 EDT    Performed On:  11/28/2016 8:42 EDT by Adela Lank, RN, PAMELA      Intervention Information:     acetaminophen  Performed by Adela Lank, RN, PAMELA on 11/28/2016 08:38:00 EDT       acetaminophen,650mg   Oral,mild pain (1-3)       Med Response   ED Medication Response :   Symptoms improved   FLOYD, RN, PAMELA - 11/28/2016 8:42 EDT

## 2016-11-28 NOTE — Nursing Note (Signed)
Medication Administration Follow Up-Text       Medication Administration Follow Up Entered On:  11/28/2016 4:45 EDT    Performed On:  11/27/2016 22:09 EDT by Effie Shy, RN, OLIVIA S      Intervention Information:     acetaminophen  Performed by Effie Shy, RN, OLIVIA S on 11/27/2016 21:09:00 EDT       acetaminophen,650mg   Oral,mild pain (1-3)       Med Response   ED Medication Response :   No adverse reaction, Symptoms improved   Numeric Rating Pain Scale :   2   Pasero Opioid Induced Sedation Scale :   1 = Awake and alert   Respiratory Rate :   16 br/min   COLEMAN, RN, OLIVIA S - 11/28/2016 4:45 EDT   Vital Signs   SpO2 :   98 %   COLEMAN, RN, OLIVIA S - 11/28/2016 4:45 EDT

## 2016-11-28 NOTE — Nursing Note (Signed)
Medication Administration Follow Up-Text       Medication Administration Follow Up Entered On:  11/28/2016 5:40 EDT    Performed On:  11/28/2016 6:00 EDT by Effie Shy, RN, OLIVIA S      Intervention Information:     acetaminophen  Performed by Effie Shy, RN, OLIVIA S on 11/28/2016 05:00:00 EDT       acetaminophen,650mg   Oral,mild pain (1-3)       Med Response   ED Medication Response :   No adverse reaction, Symptoms improved   Numeric Rating Pain Scale :   2   Pasero Opioid Induced Sedation Scale :   1 = Awake and alert   Respiratory Rate :   16 br/min   COLEMAN, RN, OLIVIA S - 11/28/2016 5:40 EDT   Vital Signs   SpO2 :   98 %   Oxygen Therapy :   Room air   Parks, RN, Maryann Alar - 11/28/2016 5:40 EDT

## 2016-11-28 NOTE — Discharge Summary (Signed)
 Inpatient Patient Summary               [facility]  [address]  [place], [place] [address]  [phone]  Patient Discharge Instructions     Name: [name] [name], [name] [name]  Current Date: 11/28/2016 06:52:18  DOB: 11-30-1965 MRN: [id number] FIN: [phone]  Patient Address: [address] [place] [facility] [address]  Patient Phone: [phone]  Primary Care Provider:  Name: Gillian Scarce  Phone: [phone]   Immunizations Provided:       Discharge Diagnosis: At high risk for breast cancer; Family history of breast cancer  Discharged To: TO, ANTICIPATED%>  Home Treatments: TREATMENTS, ANTICIPATED%>  Devices/Equipment: EQUIPMENT REHAB%>  Post Hospital Services: HOSPITAL SERVICES%>  Professional Skilled Services: SKILLED SERVICES%>  Therapist, sports and Community Resources:               SERV AND COMM RES, ANTICIPATED%>  Mode of Discharge Transportation: TRANSPORTATION%>  Discharge Orders         Discharge Patient 11/28/16 6:29:00 EDT, Discharge Home/Self Care         Comment:      Medications   During the course of your visit, your medication list was updated with the most current information. The details of those changes are reflected below:         New Medications  Other Medications  doxycycline (doxycycline hyclate 100 mg oral capsule) 1 Capsules Oral (given by mouth) 2 times a day.  Last Dose:____________________  Medications that have not changed  Other Medications  lisinopril 10 Milligram Oral (given by mouth) every day.  Last Dose:____________________  loratadine (Claritin 10 mg oral tablet) 1 Tabs Oral (given by mouth) once a day (in the evening) as needed as needed for allergy symptoms.  Last Dose:____________________         [facility] would like to thank you for allowing [place] to assist you with your healthcare needs. The following includes patient education materials and information regarding your injury/illness.     [name], [name] has been given the following list of  follow-up instructions, prescriptions, and patient education materials:  Follow-up Instructions:             With: Address: When:   [address]  [place], [place]  [address]  [phone] Within 1 week                  It is important to always keep an active list of medications available so that you can share with other providers and manage your medications appropriately. As an additional courtesy, we are also providing you with your final active medications list that you can keep with you.           doxycycline (doxycycline hyclate 100 mg oral capsule) 1 Capsules Oral (given by mouth) 2 times a day.  lisinopril 10 Milligram Oral (given by mouth) every day.  loratadine (Claritin 10 mg oral tablet) 1 Tabs Oral (given by mouth) once a day (in the evening) as needed as needed for allergy symptoms.      Take only the medications listed above. Contact your doctor prior to taking any medications not on this list.        Discharge instructions, if any, will display below     Instructions for Diet: INSTRUCTIONS FOR DIET%>   Instructions for Supplements: SUPPLEMENT INSTRUCTIONS%>   Instructions for Activity: INSTRUCTIONS FOR ACTIVITY%>   Instructions for Wound Care: INSTRUCTIONS FOR  WOUND CARE%>     Medication leaflets, if any, will display below     Patient education materials, if any, will display below           IS IT A STROKE? Act FAST and Check for these signs:    FACE                         Does the face look uneven?    ARM                         Does one arm drift down?    SPEECH                    Does their speech sound strange?    TIME                         Call 9-1-1 at any sign of stroke  ---------------------------------------------------------------------------------------------------------------------------  Heart Attack Signs  Chest discomfort: Most heart attacks involve discomfort in the center of the chest and lasts more than a few minutes, or goes away and comes  back. It can feel like uncomfortable pressure, squeezing, fullness or pain.  Discomfort in upper body: Symptoms can include pain or discomfort in one or both arms, back, neck, jaw or stomach.  Shortness of breath: With or without discomfort.  Other signs: Breaking out in a cold sweat, nausea, or lightheaded.  Remember, MINUTES DO MATTER. If you experience any of these heart attack warning signs, call 9-1-1 to get immediate medical attention!     ---------------------------------------------------------------------------------------------------------------------------  [facility] allows you to manage your health, view your test results, and retrieve your discharge documents from your hospital stay securely and conveniently from your computer.  To begin the enrollment process, visit [email or web address] Click on ???Sign up now??? under [facility].

## 2016-11-28 NOTE — Discharge Summary (Signed)
 Inpatient Clinical Summary             [facility]  Post-Acute Care Transfer Instructions  PERSON INFORMATION   Name: [name] [name], [name] [name]  MRN: [id number]    FIN#: [id number]   PHYSICIANS  Admitting Physician: Bronson Ing [facility]  Attending Physician: Neena Rhymes   PCP: Gillian Scarce  Discharge Diagnosis:  At high risk for breast cancer; Family history of breast cancer  Comment:       PATIENT EDUCATION INFORMATION  Instructions:               Medication Leaflets:               Follow-up:                          With: Address: When:   [address]  [place], [place]  [address]  [phone] Within 1 week                           MEDICATION LIST  Medication Reconciliation at Discharge:         New Medications  Other Medications  doxycycline (doxycycline hyclate 100 mg oral capsule) 1 Capsules Oral (given by mouth) 2 times a day.  Last Dose:____________________  Medications that have not changed  Other Medications  lisinopril 10 Milligram Oral (given by mouth) every day.  Last Dose:____________________  loratadine (Claritin 10 mg oral tablet) 1 Tabs Oral (given by mouth) once a day (in the evening) as needed as needed for allergy symptoms.  Last Dose:____________________         Patient???s Final Home Medication List Upon Discharge:          doxycycline (doxycycline hyclate 100 mg oral capsule) 1 Capsules Oral (given by mouth) 2 times a day.  lisinopril 10 Milligram Oral (given by mouth) every day.  loratadine (Claritin 10 mg oral tablet) 1 Tabs Oral (given by mouth) once a day (in the evening) as needed as needed for allergy symptoms.         Comment:       ORDERS         Order Name Order Details   Discharge Patient 11/28/16 6:29:00 EDT, Discharge Home/Self Care

## 2017-03-01 NOTE — Nursing Note (Signed)
Medication Administration Follow Up-Text       Medication Administration Follow Up Entered On:  03/01/2017 20:39 EST    Performed On:  03/01/2017 20:15 EST by COEN, RN, BEVERLY R      Intervention Information:     ondansetron  Performed by Noralyn Pick, RN, ROBIN E on 03/01/2017 19:22:00 EST       ondansetron,4mg   Oral,nausea/vomiting       Med Response   ED Medication Response :   Symptoms improved   COEN, RN, BEVERLY R - 03/01/2017 20:39 EST

## 2017-03-01 NOTE — Nursing Note (Signed)
Medication Administration Follow Up-Text       Medication Administration Follow Up Entered On:  03/01/2017 18:52 EST    Performed On:  03/01/2017 18:00 EST by [employer], RN, Suzan Slick      Intervention Information:     hydromorphone  Performed by [employer], RN, Suzan Slick on 03/01/2017 18:00:00 EST       hydromorphone,0.4mg   IV Push,Forearm, Mid Right,other (see comment)       Med Response   ED Medication Response :   Continue to observe for symptoms, Administered as part of Standard of Care   [employer], RN, Suzan Slick - 03/01/2017 18:52 EST

## 2017-03-01 NOTE — Nursing Note (Signed)
Adult Patient History Form-Text       Adult Patient History Entered On:  03/01/2017 19:05 EST    Performed On:  03/01/2017 19:03 EST by Noralyn Pick, RN, ROBIN E               General Info   Patient Identified :   Identification band   Patient Identified :   [name] [name]   Information Given By :   Self   Preferred Mode of Communication :   Verbal   Accompanied By :   Spouse   Pregnancy Status :   Patient denies   Has the patient received chemotherapy or biotherapy within the last 48 hours? :   No   In Clinical Trial With Signed Consent for Related Condition :   No signed consent for clinical trial   Is the patient currently (2-3 days) receiving radiation treatment? :   No   CARROLL, RN, ROBIN E - 03/01/2017 19:03 EST   Immunizations   Influenza Vaccine Status :   Received prior to admission, during current flu season   Last Tetanus :   Less than 5 years   CARROLL, RN, ROBIN E - 03/01/2017 19:03 EST   ID Risk Screen   Chills :   No   Cough (Any Duration) :   No   Fever :   No   Hemoptysis (Blood in Sputum) :   No   Night Sweats :   No   Weight Loss Greater Than 10 Pounds :   No   Hx of TB Now or at Any Time In the Past (Even if on Meds) :   No   Foreign-Born :   No   Homeless or In Shelter :   No   Incarcerated Within Last 2 Years :   No   Intravenous Drug User :   No   Female Homosexual :   No   New TST/IGRA Results Pos(Within 2 yrs),Hx Recent TB Exposure :   No   CARROLL, RN, ROBIN E - 03/01/2017 19:03 EST   3 or more loose/watery stools :   No   MRSA/VRE Screening :   None of these apply   Patient Recent Travel History :   No recent travel   Family Member Travel History :   No recent travel   [place], RN, [place] E - 03/01/2017 19:03 EST   Infectious Disease Risk Factor Grid   Chills :   No   Fever :   No   Fatigue :   No   Headache :   No   Runny or Stuffy Nose :   No   Sore Throat :   No   Difficulty Breathing :   No   Shortness of Breath :   No   New or Worsening Cough :   No   Wheezing :   No   Vomiting :   No   Diarrhea :    No   Abdominal (Stomach Pain) :   No   Muscle Pain :   No   Weakness/Numbness :   No   Recent Exposure to Communicable Disease :   No   Illness With Generalized Rash :   No   Abnormal Bleeding :   No   Unexplained Hemorrhage (Bleeding or Bruising) :   No   Arthralgia :   No   Conjunctivitis :   No   CARROLL, RN, ROBIN E - 03/01/2017 19:03 EST  Bloodless Medicine   Will Patient Accept Blood Transfusion and/or Blood Products :   Yes   CARROLL, RN, ROBIN E - 03/01/2017 19:03 EST   Nutrition   Nutritional Risk Factors :   None   Unintentional Weight Change Greater Than 10 lbs in the Last 6 Months :   No   CARROLL, RN, ROBIN E - 03/01/2017 19:03 EST   Functional   Sensory Deficits :   None   ADLs Prior to Admission :   Independent   CARROLL, RN, ROBIN E - 03/01/2017 19:03 EST   Social History   Social History   (As Of: 03/01/2017 19:05:29 EST)   Tobacco:        Never smoker   (Last Updated: 08/25/2016 10:32:36 EDT by Melida Quitter, RN, Orson Ape)          Alcohol:        Current, Beer, Liquor, 3-5 times per week   (Last Updated: 08/25/2016 10:32:54 EDT by Melida Quitter, RN, Orson Ape)          Substance Abuse:        Denies   (Last Updated: 08/25/2016 10:33:00 EDT by Melida Quitter, RN, Orson Ape)            Spiritual   Faith/Denomination :   Non-denominational   Do you have a concern that you would like to address with a Chaplain? :   No   Do you have any religious/spiritual/cultural beliefs that could impact the way your care is provided? :   No   CARROLL, RN, ROBIN E - 03/01/2017 19:03 EST   Harm Screen   Injuries/Abuse/Neglect in Household :   Denies   Feels Unsafe at Home :   No   Recent Life Events :   None   Family Behavioral Health History :   None   Previous Mental Illness Diagnosis :   None   Symptoms of Psychosis Present :   None   Suicidal Behavior :   None   Self Harming Behavior :   None   Suicidal Ideation :   None   CARROLL, RN, ROBIN E - 03/01/2017 19:03 EST   Advance Directive   Advance Directive :   No    Patient Wishes to Receive Further Information on Advance Directives :   No   Noralyn Pick, RN, Drema [place] - 03/01/2017 19:03 EST   Education   Written Language :   [place] P[place]   Caregiver/Advocate Written Language :   Valora Corporal, RN, ROBIN E - 03/01/2017 19:03 EST   Caregiver/Advocate Language   Patient :   None   Family :   None   CARROLL, RN, ROBIN E - 03/01/2017 19:03 EST   Barriers to Learning :   None evident   Teaching Method :   Demonstration, Explanation   CARROLL, RN, ROBIN E - 03/01/2017 19:03 EST   DCP GENERIC CODE   Unit/Room Orientation :   Verbalizes understanding   Environmental Safety :   Verbalizes understanding   Hand Washing :   Verbalizes understanding   Infection Prevention :   Verbalizes understanding   DVT Prophylaxis :   Verbalizes understanding   Isolation Precaution :   Verbalizes understanding   CARROLL, RN, ROBIN E - 03/01/2017 19:03 EST   DC Needs   Living Situation :   Home independently   Home Caregiver Name/Relationship :   Adiana Hartner Husband [phone]   CARROLL, RN, ROBIN E - 03/01/2017 19:03 EST  Valuables and Belongings   DCP GENERIC CODE   Transfer/Discharge :   Clothes, Purse/Wallet, Cell phone   CARROLL, RN, ROBIN E - 03/01/2017 19:03 EST   Patient Search Completed :   NA   CARROLL, RN, ROBIN E - 03/01/2017 19:03 EST   Admission Complete   Admission Complete :   Yes-unable to obtain further information   CARROLL, RN, ROBIN E - 03/01/2017 19:03 EST

## 2017-03-01 NOTE — Nursing Note (Signed)
Nursing Discharge Summary - Text       Physician Discharge Summary Entered On:  03/01/2017 21:18 EST    Performed On:  03/01/2017 21:17 EST by COEN, RN, BEVERLY R               DC Information   Provider Instructions for Diet :   Regular diet   Provider Instructions for Activity :   No lifting   Provider Instructions for Wound Care :   Keep clean and dry, Keep open to air, No tub bath   COEN, RN, BEVERLY R - 03/01/2017 21:17 EST

## 2017-03-01 NOTE — Procedures (Signed)
IntraOp Record - [facility]             IntraOp Record - SFOR Summary                                                                   Primary Physician:        Bronson Ing                              [facility]    Case Number:              [id number]    Finalized Date/Time:      03/01/17 17:28:03    Pt. Name:                 [name] [name], [name] [name]    D.O.B./Sex:               November 22, 1965    Female    Med Rec #:                [id number]    Physician:                Bronson Ing                              [facility]    Financial #:              [id number]    Pt. Type:                 S    Room/Bed:                 /    Admit/Disch:              03/01/17 10:25:00 -    Institution:       [facility] - Case Times                                                                                                         Entry 1                                                                                                          Patient      In Room Time  03/01/17 13:54:00               Out Room Time                   03/01/17 17:25:00    Anesthesia     Procedure      Start Time               03/01/17 14:48:00               Stop Time                       03/01/17 17:20:00    Last Modified By:         Johnny Bridge                              03/01/17 17:27:32      SFOR - Case Times Audit                                                                          03/01/17 17:27:32         Owner: Clovis Fredrickson                               Modifier: [id number]                                                       <+> 1         Out Room Time        <+> 1         Stop Time     03/01/17 15:12:10         Owner: Clovis Fredrickson                               Modifier: [email or web address]                                                        <+> 1         Start Time        [facility] - Case Attendance                                                                                                    Entry 1  Entry 2                         Entry  3                                          Case Attendee             Bronson Ing              PHELPS-MD,  Illene Regulus,  [facility]                              [facility]    Role Performed            Surgeon Primary                 Anesthesiologist                CRNA    Time In                   03/01/17 13:54:00               03/01/17 13:54:00               03/01/17 13:54:00    Time Out                  03/01/17 17:25:00               03/01/17 17:25:00               03/01/17 17:25:00    Procedure                 Abdominoplasty(Possible)                              , Fat Transfer                              (site)(Bilateral)    Last Modified By:         Celine Ahr, RN, Alma Downs, RN, Alma Downs, RNDarl Pikes                              03/01/17 17:27:45               03/01/17 17:27:45               03/01/17 17:27:45                                Entry 4                         Entry 5                         Entry 6  Case Attendee             RHODES,  Robet Leu, RN, Robin Searing, RN, PATRICIA    Role Performed            Surgical Scrub                  Circulator                      Circulator    Time In                   03/01/17 13:54:00               03/01/17 13:54:00               03/01/17 13:54:00    Time Out                  03/01/17 17:25:00               03/01/17 17:25:00               03/01/17 17:25:00    Procedure     Last Modified By:         Celine Ahr RN, Alma Downs, RN, Alma Downs, RNDarl Pikes                              03/01/17 17:27:45               03/01/17 17:27:45               03/01/17 17:27:45                                Entry 7                                                                                                          Case Attendee             Celine Ahr RN, Susan    Role Performed            Circulator Relief    Time In                    03/01/17 15:05:00    Time Out                  03/01/17 17:25:00    Procedure     Last Modified By:         Celine Ahr RN, Darl Pikes  03/01/17 17:27:45    General Comments:            Wendall Mola      S[facility] - Case Attendance Audit                                                                     03/01/17 17:27:45         Owner: [id number]                              Modifier: [id number]                                                           1     <*> Time Out            1     <*> Procedure            1     <*> Procedure                              Abdominoplasty(Possible), Fat Transfer (site)(Bilateral)            1     <*> Procedure                              Abdominoplasty(Possible), Fat Transfer (site)(Bilateral)            2     <*> Time Out            3     <*> Time Out            4     <*> Time Out            5     <*> Time Out            6     <*> Time Out            7     <*> Time Out     03/01/17 15:42:13         Owner: NETHPA                               Modifier: [id number]                                                           7     <*> Case Attendee                          Celine Ahr RN, Darl Pikes            7     <*>  Case Attendee                          Celine Ahr, RN, Darl Pikes            7     <*> Role Performed            7     <*> Role Performed            7     <*> Time In            7     <*> Time In     03/01/17 15:11:37         Owner: NETHPA                               Modifier: [email or web address]                                                            1     <*> Procedure            1     <*> Procedure                              Abdominoplasty(Possible), Fat Transfer (site)(Bilateral)            1     <*> Procedure                              Abdominoplasty(Possible), Fat Transfer (site)(Bilateral)            1     <*> Procedure                              Abdominoplasty(Possible), Fat Transfer (site)(Bilateral)            2     <*> Time In            2     <*>  Time In            3     <*> Time In            3     <*> Time In            4     <*> Time In            4     <*> Time In            5     <*> Time In            5     <*> Time In            6     <*> Time In            6     <*> Time In     03/01/17 15:00:53         Owner: NETHPA  Modifier: [email or web address]                                                            1     <*> Time In            1     <*> Time In            1     <*> Procedure                              Abdominoplasty(Possible), Fat Transfer (site)(Bilateral)            1     <*> Procedure                              Abdominoplasty(Possible), Fat Transfer (site)(Bilateral)            1     <*> Procedure                              Abdominoplasty(Possible), Fat Transfer (site)(Bilateral)            2     <*> Case Attendee                          PHELPS-MD,  SARAH            2     <*> Case Attendee                          PHELPS-MD,  SARAH            2     <*> Role Performed            2     <*> Role Performed            3     <*> Case Attendee                          SANDER-CRNA,  MELINDA H            3     <*> Case Attendee                          SANDER-CRNA,  MELINDA H            3     <*> Role Performed            3     <*> Role Performed            4     <*> Case Attendee                          RHODES,  SUSAN D            4     <*> Case Attendee                          RHODES,  SUSAN D            4     <*> Role Performed            4     <*> Role Performed            5     <*> Case Attendee                          Lorelle Formosa, RN, Lelon Mast L            5     <*> Case Attendee                          Lorelle Formosa, RN, Samantha L            5     <*> Role Performed            5     <*> Role Performed            6     <*> Case Attendee                          NETHERCOTT, RN, PATRICIA            6     <*> Case Attendee                          NETHERCOTT, RN, PATRICIA            6     <*> Role Performed            6     <*> Role  Performed        S[facility] - Skin Assessment                                                                          Pre-Care Text:            A.240 Assesses baseline skin condition Im.120 Implements protective measures to prevent skin or tissue injury           due to mechanical sources  Im.280.1 Implements progective measures to prevent skin or tissue injury due to           thermal sources Im.360 Monitors for signs and symptons of infection                              Entry 1  Skin Integrity            Intact    Last Modified By:         Billey Chang, RN,                              PATRICIA 03/01/17                              15:00:57    Post-Care Text:            E.10 Evaluates for signs and symptoms of physical injury to skin and tissue E.270 Evaluate tissue perfusion           O.60 Patient is free from signs and symptoms of injury caused by extraneous objects   O.210 Patinet's tissue           perfusion is consistent with or improved from baseline levels      SFOR - Patient Positioning                                                                      Pre-Care Text:            A.240 Assesses baseline skin condition A.280 Identifies baseline musculoskeletal status A.280.1 Identifies           physical alterations that require additional precautions for procedure-specific positioning A.510.8 Maintains           patient's dignity and privacy Im.120 Implements protective measures to prevent skin/tissue injury due to           mechanical sources Im.40 Positions the patient Im.80 Applies safety devices                              Entry 1                                                                                                          Procedure                 Abdominoplasty(Possible)        Body Position                   Supine                              , Fat Transfer                               (site)(Bilateral)    Left Arm Position         Extended  on Padded Arm          Right Arm Position              Extended on Padded Arm                              Board w/Security Strap                                          Board w/Security Strap    Left Leg Position         Extended Security               Right Leg Position              Extended Security                              Strap, Pillow Under Knee                                        Strap, Pillow Under Knee    Feet Uncrossed            Yes                             Pressure Points                 Yes                                                              Checked     Positioning Device        Pillow, Safety Strap,           Positioned By                   Lorelle Formosa, RN, Samantha L,                              Heel pad                                                        SANDER-CRNA,  Phillips Odor,  RICHARD  MAHLON    Outcome Met (O.80)        Yes    Last Modified By:         Billey Chang, RN,                              PATRICIA 03/01/17                              15:01:43    Post-Care Text:            E.10 Evaluates for signs and symptoms of physical injury to skin and tissue E.290 Evaluates musculoskeletal           status O.80 Patient is free from signs and symptoms of injury related to positioning O.120 the patient is free           from signs and symptoms of injury related to transfer/transport  O.250 Patient's musculoskeletal status is           maintained at or improved from baseline levels      S[facility] - Skin Prep                                                                                Pre-Care Text:            A.30 Verifies allergies A.20 Verifies procedure, surgical site, and laterality A.510.8 Maintains paritnet's           dignity and  privacy Im.270 Performs Skin Preparation Im.270.1 Implements protective measures to prevent skin           and tissue injury due to chemical sources  A.300.1 Protects from cross-contamination                              Entry 1                                                                                                          Hair Removal     Skin Prep      Prep Agents (Im.270)     Chlorhexidine Gluconate         Prep Area (Im.270)              Abdomen, Calf, Chin to                              2% w/Alcohol  knees, Other     Prep By                  Clovis Fredrickson D    Outcome Met (O.100)       Yes    Last Modified By:         Billey Chang, RN,                              PATRICIA 03/01/17                              15:06:37    Post-Care Text:            E.10 Evaluates for signs and symptoms of physical injury to skin and tissue O.100 Patient is free from signs           and symptoms of chemical injury  O.740 The patient's right to privacy is maintained    General Comments:            CHIN TO MID CALF PREP BILATERAL AND BED TO BED, CIRCUMFERENCE AROUND BOTH LEGS TO MID CALFS      SFOR - Skin Prep Audit                                                                           03/01/17 15:06:37         Owner: NETHPA                               Modifier: [email or web address]                                                            1     <*> Prep By                                Clovis Fredrickson D            1     <*> Prep By                                Lorelle Formosa RN, Samantha L            1     <*> Prep By                                Lorelle Formosa RN, Dairl Ponder        S[facility] - Counts Initial and Final  Pre-Care Text:            A.20 Verifies operative procedure, sugical site, and laterality A.20.2 Assesses the risk for unintended           retained foreign body Im.20 Performs required counts                               Entry 1                                                                                                          Initial Counts      Initial Counts           RHODES,  SUSAN D,               Items included in               Sponges, Sharps     Performed By             Billey Chang, RN, PATRICIA        the Initial Count     Final Counts      Final Counts             Celine Ahr, RN, Darl Pikes,               Final Count Status              Correct     Performed By             Lorelle Formosa, RN, Samantha L     Items Included in        Sponges, Sharps     Final Count     Outcome Met (O.20)        Yes    Last Modified By:         Celine Ahr, RN, Darl Pikes                              03/01/17 17:16:24    Post-Care Text:            E.50 Evaluates results of the surgical count O.20 Patient is free from unintended retained foreign objects      [facility] - Counts Initial and Final Audit                                                            03/01/17 17:16:24         Owner: [email or web address]                               Modifier: [id number]                                                       <+>  1         Final Counts Performed By        <+> 1         Final Count Status        <+> 1         Items Included in Final Count        <+> 1         Outcome Met (O.20)        S[facility] - Counts Additional                                                                        Pre-Care Text:            A.20 Verifies operative procedure, sugical site, and laterality A.20.2 Assesses the risk for unintended           retained foreign body Im.20 Performs required counts                              Entry 1                                                                                                          Additional Count          Closing Count                   Additional Count                Wingo, RN, Darl Pikes,    Type                                                      Participants                    RHODES,  SUSAN D    Count Status              Correct                          Items Counted                   Sponges, Sharps    Outcome Met (O.20)        Yes    Last Modified By:         Celine Ahr, RN, Darl Pikes                              03/01/17 16:12:38  Post-Care Text:            E.50 Evaluates results of the surgical count O.20 Patient is free from unintended retained foreign objects      S[facility] - General Case Data                                                                        Pre-Care Text:            A.350.1 Classifies surgical wound                              Entry 1                                                                                                          Case Information      ASA Class                2                               Case Level                      Level 3     OR                       SF 01                           Specialty                       Plastic (SN)     Wound Class              1-Clean    Preop Diagnosis           N65.0                           Postop Diagnosis                SAME    Last Modified By:         Celine Ahr, RNDarl Pikes                              03/01/17 15:43:35    Post-Care Text:            O.760 Patient receives consistent and comparable care regardless of the setting      [facility] - General Case Data Audit  03/01/17 15:43:35         Owner: NETHPA                               Modifier: [id number]                                                       <+> 1         Postop Diagnosis        SFOR - Fire Risk Assessment                                                                                               Entry 1                                                                                                          Fire Risk                 Alcohol Based Prep              Fire Risk Score                 2    Assessment: If            Solution, Ignition    checked, checkmark        Source In Use    = 1 point     Last Modified By:         Billey Chang, RN,                               PATRICIA 03/01/17                              15:09:20      SFOR - Time Out                                                                                 Pre-Care Text:            A.10 Confirms patient  identity A.20 Verifies operative procedure, surgical site, and laterality A.20.1 Verifies           consent for planned procedure A.30 Verifies allergies                              Entry 1                                                                                                          Procedure                 Abdominoplasty(Possible)        Is everyone ready               Yes                              , Fat Transfer                  to perform time out                               (site)(Bilateral)    Have all members of       Yes                             Patient name and                Yes    the surgical team                                         DOB confirmed     been introduced     Allergies discussed       Yes                             Surgical procedure              Yes                                                              to be performed                                                               confirmed and  verified by                                                               completed surgical                                                               consent     Correct surgical          Yes                             Correct laterality              Yes    site marked and                                           confirmed     initials are     visible through     prepped and draped     field (or     alternative ID band     used)     Correct patient           Yes                             Surgeon shares                  Yes    position confirmed                                        operative plan,                                                               possible                                                                difficulties,                                                               expected duration,  anticipated blood                                                               loss and reviews                                                               all                                                               critical/specific                                                               concerns     Required blood            Yes                             Essential imaging               Yes    products, implants,                                       available and fetal     devices and/or                                            heartones confirmed     special equipment                                         (if applicable)     available and     sterility confirmed     VTE prophylaxis           Yes                             Antibiotics ordered             Yes    addressed                                                 and administered     Anesthesia shares  Yes                             Fire risk                       Yes    anesthetic plan and                                       assessment scored     reviews patient                                           and plan discussed     specific concerns     Appropriate drying        Yes                             Surgeon states:                 Yes    time for prep                                             Does anyone have     observed before                                           any concerns? If     draping                                                   you see, suspect,                                                               or feel that                                                               patient care is                                                               being compromised,  speak up for                                                               patient safety     Time Out Complete         03/01/17 14:43:00    Last Modified By:         Billey Chang, RN,                              PATRICIA 03/01/17                              15:17:11    Post-Care Text:            E.30 Evaluates verification process for correct patient, site, side, and level surgery      S[facility] - Time Out Audit                                                                            03/01/17 15:17:11         Owner: NETHPA                               Modifier: [email or web address]                                                            1     <*> Procedure                              Abdominoplasty(Possible), Fat Transfer (site)(Bilateral)            1     <+> Surgeon shares operative plan,                      possible difficulties, expected                      duration, anticipated blood loss                      and reviews all critical/specific                      concerns        [facility] - Debrief  Pre-Care Text:            Im.330 Manages specimen handling and disposition                              Entry 1                                                                                                          Procedure                 Abdominoplasty(Possible)        Final counts                    Yes                              , Fat Transfer                  correct and                               (site)(Bilateral)               verbally verified                                                               with                                                               surgeon/licensed                                                               independent                                                               practitioner (if  applicable)     Actual procedure          Yes                             Postop diagnosis                Yes    performed confirmed                                       confirmed     Wound                     Yes                             Confirm specimens               Yes    classification                                            and specimens     confirmed                                                 labeled                                                               appropriately (if                                                               applicable)     Foley catheter            Yes                             Patient recovery                Yes    removed (if                                               plan confirmed     applicable)     Debrief Complete          03/01/17 17:10:00    Last Modified By:         Celine Ahr RN, Susan                              03/01/17 17:10:20    Post-Care Text:  E.800 Ensures continuity of care E.50 Evaluates results of the surgical count O.30 Patient's procedure is           performed on the correct site, side, and level O.50 patient's current status is communicated throughout the           continuum of care O.40 Patient's specimen(s) is managed in the appropriate manner      [facility] - Debrief Audit                                                                             03/01/17 17:10:20         Owner: [id number]                              Modifier: [id number]                                                           1     <*> Procedure                              Abdominoplasty(Possible), Fat Transfer (site)(Bilateral)            1     <+> Debrief Complete        S[facility] - Cautery                                                                                  Pre-Care Text:            A.240 Assesses baseline skin condition A280.1 Identifies baseline musculoskeletal status Im.50 Implements           protective measures to prevent injury due to electrical  sources  Im.60 Uses supplies and equipment within safe           parameters Im.80 Applies safety devices                              Entry 1  ESU Type                  GENERATOR                       Identification                  [id number]                              C[facility]EN/[facility]              Number     Coag Setting (watts)      35                              Cut Setting (watts)             35    Grounding Pad             Yes                             Grounding Pad Site              Arm Upper Right    Needed?     Grounding Reinaldo Meeker, Charity fundraiser, Samantha L           Outcome Met (O.10)              Yes    Applied By     Last Modified By:         Billey Chang RN,                              PATRICIA 03/01/17                              15:13:21    Post-Care Text:            E.10 Evaluates for signs and symptoms of physical injury to skin and tissue O.10 Patient is free from signs and           symptoms of injury related to thermal sources  O.70 Patient is free from signs and symptoms of electrical injury      S[facility] - Patient Care Devices                                                                     Pre-Care Text:            A.200 Assesses risk for normothermia regulation A.40 Verifies presence of prosthetics or corrective devices           Im.280 Implements thermoregulation measures Im.60 Uses supplies and equipment within safe parameters                              Entry 1  Equipment Type            MACHINE SEQUENTIAL              SCD Sleeve Site                 Feet Bilateral                              COMPRESSION    Equipment/Tag Number      [id number]                          Initiated Pre                   Yes                                                              Induction     Last  Modified By:         Billey Chang, RN,                              PATRICIA 03/01/17                              15:14:09    Post-Care Text:            E.10 Evaluates signs and symptoms of physical injury to skin and tissue O.60 Patient is free from signs and           symptoms of injury caused by extraneous objects      [facility] - Medications                                                                              Pre-Care Text:            A.10 Confirms patient identity A.30 Verifies allergies Im.220 Administers prescribed medications Im.220.2           Administers prescribed antibiotic therapy as ordered                              Entry 1                         Entry 2                                                                          Time Administered         03/01/17 14:55:00  Medication                TUMESCENT SOLUTION              LIDOCAINE 1% W/EPI                                                              VIAL    Route of Admin            Local Injection                 Local Injection    Dose/Volume                                                  (include amount and     unit of measure)     Site     Site Detail     Administered By           Delorise Jackson,  RICHARD                              [facility]                          MAHLON    Outcome Met (O.130)       Yes                             Yes    Last Modified By:         Marguarite Arbour, RNDyke Brackett 03/01/17               03/01/17 17:10:02                              15:16:11    Post-Care Text:            E.20 Evaluates response to medications O.130 Patient receives appropriately administerd medication(s)      S[facility] - Medications Audit                                                                         03/01/17 17:10:02         Owner: [id number]  Modifier: [id number]                                                            2     <*> Medication                             LIDOCAINE 1% W/EPI VIAL            2     <+> Dose/Volume (include amount and                      unit of measure)     03/01/17 15:45:22         Owner: [email or web address]                               Modifier: [id number]                                                           2     <*> Medication                             LIDOCAINE 1% W/EPI VIAL            2     <+> Route of Admin            2     <+> Administered By            2     <+> Outcome Met (O.130)        S[facility] - Tubes, Drains, Catheters                                                                 Pre-Care Text:            A.310 Identifies factors associated with an increased risk for hemorrhage or fluid and electrolyte imbalance           Im.250 Administers care to invasive device sites                              Entry 1  Device Description        DRAIN WOUND JACKSON             Location                        Abdomen                              PRATT 15FR W/ TROCAR    Last Modified By:         Celine Ahr, RN, Darl Pikes                              03/01/17 15:59:59    Post-Care Text:            E.340 Evaluates tubes and drains are intact and functioning as planned O.60 Patient is free from signs and           symptoms of injury caused by extraneous objects      SFOR - Communication                                                                            Pre-Care Text:            A.520 Identifies barriers to communication (Patient and Family Communications) A.20 Verifies operative           procedure, surgical site, and laterality (Hand-off Communications) Im.500 Provides status reports to family           members Im.150 Develops individualized plan of care                              Entry 1                         Entry 2                                                                           Communication             Phone Call                      Phone Call    Communication By          Lorelle Formosa, RN, Charise Killian, RN, Susan    Date and Time             03/01/17 15:42:00               03/01/17 16:44:00    Communication To          [employer]  CARL    Last Modified By:         Celine Ahr, RN, Alma Downs, RNDarl Pikes                              03/01/17 15:42:30               03/01/17 16:48:14    Post-Care Text:            E.520 Evaluates psychosocial response to plan of care O.500 Patient or designated support person demonstrates           knowledge of the expected psychosocial responses to the procedure E.800 Ensures continuity of care O.50           Patient's current status is communicated throughout the continuum of care      [facility] - Communication Audit                                                                       03/01/17 16:48:14         Owner: [id number]                              Modifier: [id number]                                                       <+> 2         Communication        <+> 2         Communication By        <+> 2         Date and Time        <+> 2         Communication To        [facility] - Dressing/Packing                                                                         Pre-Care Text:            A.350 Assesses susceptibility for infection Im.250 Administers care to invasive devices Im.290 Administer care           to wound sites  Im.300 Implements aseptic technique                              Entry 1  Site                      Abdomen    Dressing Item     Details      Dressing Item            Liquid Bandage,     (Im.290)                 Occlusive Dressing,                              Other (See Comment)    Last Modified By:         Celine Ahr, RN, Darl Pikes                              03/01/17 17:17:26    Post-Care Text:            E.320  Evaluate factors associted with increased risk for postoperative infection at the completion of the           procedure O.200 Patient's wound perfusion is consistent with or improved from baseline levels  O.Patient is           free from signs and symptoms of infection    General Comments:            [facility]      [facility] - Dressing/Packing Audit                                                                    03/01/17 17:17:26         Owner: [id number]                              Modifier: [id number]                                                           1     <*> Dressing Item (Im.290)            1     <*> Dressing Item (Im.290)            1     <*> Dressing Item (Im.290)                 Liquid Bandage            1     <*> Dressing Item (Im.290)                 Liquid Bandage            1     <*> Dressing Item (Im.290)                 Liquid Bandage        SFOR - Procedures  Pre-Care Text:            A.20 Verifies operative procedure, surgical site, and laterality Im.150 Develops individualized plan of care                              Entry 1                         Entry 2                                                                          Procedure     Description      Procedure                Abdominoplasty                  Fat Transfer (site)     Modifiers                Possible                        Bilateral     Surgical Procedure       POSS MINI ABDOMINOPLASTY        FAT GRAFTING TO     Text                                                     BILATERAL BREAST    Primary Procedure         No                              Yes    Primary Surgeon           Legrand Rams                              [facility]                          [facility]    Start                     03/01/17 14:48:00               03/01/17 14:48:00    Stop                      03/01/17 17:20:00               03/01/17 17:20:00     Anesthesia Type           General                         General    Surgical Service  Plastic (SN)                    Plastic (SN)    Wound Class               1-Clean                         1-Clean    Last Modified By:         Celine Ahr, RN, Alma Downs, RNDarl Pikes                              03/01/17 17:27:35               03/01/17 17:27:35    Post-Care Text:            O.730 The patient's care is consistent with the individualized perioperative plan of care      SFOR - Transfer                                                                                                           Entry 1                                                                                                          Transferred By            [facility],  Juliette Alcide           Via                             Urbano Heir, RN, Susan    Post-op Destination       PACU    Skin Assessment      Condition                Intact    Last Modified By:         Celine Ahr, RN, Darl Pikes                              03/01/17 17:10:38      Case Comments                                                                                         <  None>              Finalized By: Celine Ahr, RN, Darl Pikes      Document Signatures                                                                             Signed By:           Celine Ahr RN, Susan 03/01/17 17:28

## 2017-03-01 NOTE — Nursing Note (Signed)
Medication Administration Follow Up-Text       Medication Administration Follow Up Entered On:  03/01/2017 20:39 EST    Performed On:  03/01/2017 20:15 EST by COEN, RN, BEVERLY R      Intervention Information:     acetaminophen  Performed by Noralyn Pick, RN, ROBIN E on 03/01/2017 19:22:00 EST       acetaminophen,650mg   Oral,mild pain (1-3)       Med Response   ED Medication Response :   Symptoms improved   Numeric Rating Pain Scale :   0 = No pain   Pasero Opioid Induced Sedation Scale :   1 = Awake and alert   Respiratory Rate :   16 br/min   COEN, RN, BEVERLY R - 03/01/2017 20:39 EST   Vital Signs   Oxygen Therapy :   Room air   Leanne Chang RN, BEVERLY R - 03/01/2017 20:39 EST

## 2017-03-01 NOTE — Nursing Note (Signed)
Medication Administration Follow Up-Text       Medication Administration Follow Up Entered On:  03/01/2017 17:56 EST    Performed On:  03/01/2017 17:50 EST by [employer], RN, Suzan Slick      Intervention Information:     hydromorphone  Performed by [employer], RN, Suzan Slick on 03/01/2017 17:50:00 EST       hydromorphone,0.4mg   IV Push,Forearm, Mid Right,other (see comment)       Med Response   ED Medication Response :   Continue to observe for symptoms, Administered as part of Standard of Care   [employer], RN, Suzan Slick - 03/01/2017 17:56 EST

## 2017-03-01 NOTE — Discharge Summary (Signed)
 Inpatient Patient Summary               [facility]  [address]  [place], [place] [address]  [phone]  Patient Discharge Instructions     Name: [name] [name], [name] [name]  Current Date: 03/01/2017 21:24:08  DOB: 10-Dec-1965 MRN: [id number] FIN: [id number]  Patient Address: [address] [place] [facility] [address]  Patient Phone: [phone]  Primary Care Provider:  Name: Gillian Scarce  Phone: [phone]   Immunizations Provided:       Discharge Diagnosis: Breast reconstruction deformity; Family history of breast cancer; Family history of cancer of colon  Discharged To: TO, ANTICIPATED%>Home with family support  Home Treatments: TREATMENTS, ANTICIPATED%>  Devices/Equipment: EQUIPMENT REHAB%>  Post Hospital Services: HOSPITAL SERVICES%>  Professional Skilled Services: SKILLED SERVICES%>  Therapist, sports and Community Resources:               SERV AND COMM RES, ANTICIPATED%>  Mode of Discharge Transportation: TRANSPORTATION%>  Discharge Orders          Discharge Patient 03/01/17 21:15:00 EST, Discharge Home/Self Care  Discharge Patient 03/01/17 21:21:00 EST, Discharge Home/Self Care, follow up tomorrow, 03/02/2017 between 0900-1300 with Dr. Clide Cliff in office.         Comment:      Medications   During the course of your visit, your medication list was updated with the most current information. The details of those changes are reflected below:          Medications that have not changed  Other Medications  doxycycline (doxycycline hyclate 100 mg oral capsule) 1 Capsules Oral (given by mouth) 2 times a day.  Last Dose:____________________  lisinopril 10 Milligram Oral (given by mouth) once a day (in the morning).  Last Dose:____________________  loratadine (Claritin 10 mg oral tablet) 1 Tabs Oral (given by mouth) once a day (in the evening) as needed as needed for allergy symptoms.  Last Dose:____________________         [facility] would like to thank you for allowing [place] to assist  you with your healthcare needs. The following includes patient education materials and information regarding your injury/illness.     Koike, Talayia has been given the following list of follow-up instructions, prescriptions, and patient education materials:  Follow-up Instructions:              With: Address: When:   [facility] [address] [place], [place] [address]  [phone] Business (1) 03/02/2017 09:00:00       With: Address: When:   [facility] [address], [address] [place] [address]  [phone] Business (1)                     It is important to always keep an active list of medications available so that you can share with other providers and manage your medications appropriately. As an additional courtesy, we are also providing you with your final active medications list that you can keep with you.            doxycycline (doxycycline hyclate 100 mg oral capsule) 1 Capsules Oral (given by mouth) 2 times a day.  lisinopril 10 Milligram Oral (given by mouth) once a day (in the morning).  loratadine (Claritin 10 mg oral tablet) 1 Tabs Oral (given by mouth) once a day (in the evening) as needed as needed for allergy symptoms.      Take only the medications listed  above. Contact your doctor prior to taking any medications not on this list.        Discharge instructions, if any, will display below     Instructions for Diet: INSTRUCTIONS FOR DIET%>Regular diet   Instructions for Supplements: SUPPLEMENT INSTRUCTIONS%>   Instructions for Activity: INSTRUCTIONS FOR ACTIVITY%>No lifting   Instructions for Wound Care: INSTRUCTIONS FOR WOUND CARE%>Keep clean and dry, Keep open to air, No tub bath     Medication leaflets, if any, will display below     Patient education materials, if any, will display below           IS IT A STROKE? Act FAST and Check for these signs:    FACE                         Does the face look uneven?    ARM                         Does one arm  drift down?    SPEECH                    Does their speech sound strange?    TIME                         Call 9-1-1 at any sign of stroke  ---------------------------------------------------------------------------------------------------------------------------  Heart Attack Signs  Chest discomfort: Most heart attacks involve discomfort in the center of the chest and lasts more than a few minutes, or goes away and comes back. It can feel like uncomfortable pressure, squeezing, fullness or pain.  Discomfort in upper body: Symptoms can include pain or discomfort in one or both arms, back, neck, jaw or stomach.  Shortness of breath: With or without discomfort.  Other signs: Breaking out in a cold sweat, nausea, or lightheaded.  Remember, MINUTES DO MATTER. If you experience any of these heart attack warning signs, call 9-1-1 to get immediate medical attention!     ---------------------------------------------------------------------------------------------------------------------------  [facility] allows you to manage your health, view your test results, and retrieve your discharge documents from your hospital stay securely and conveniently from your computer.  To begin the enrollment process, visit [email or web address] Click on ???Sign up now??? under [facility].

## 2017-03-01 NOTE — Discharge Summary (Signed)
 Inpatient Clinical Summary             [facility]  Post-Acute Care Transfer Instructions  PERSON INFORMATION   Name: [name] [name], [name] [name]  MRN: [id number]    FIN#: [id number]   PHYSICIANS  Admitting Physician: [facility]  Attending Physician: Neena Rhymes   PCP: Gillian Scarce  Discharge Diagnosis:  Breast reconstruction deformity; Family history of breast cancer; Family history of cancer of colon  Comment:       PATIENT EDUCATION INFORMATION  Instructions:               Medication Leaflets:               Follow-up:                           With: Address: When:   [facility] [address], [address] [place], [place] [address]  [phone] Business (1) 03/02/2017 09:00:00       With: Address: When:   [facility] [address], [address] [place] [address]  [phone] Business (1)                              MEDICATION LIST  Medication Reconciliation at Discharge:          Medications that have not changed  Other Medications  doxycycline (doxycycline hyclate 100 mg oral capsule) 1 Capsules Oral (given by mouth) 2 times a day.  Last Dose:____________________  lisinopril 10 Milligram Oral (given by mouth) once a day (in the morning).  Last Dose:____________________  loratadine (Claritin 10 mg oral tablet) 1 Tabs Oral (given by mouth) once a day (in the evening) as needed as needed for allergy symptoms.  Last Dose:____________________         Patient???s Final Home Medication List Upon Discharge:           doxycycline (doxycycline hyclate 100 mg oral capsule) 1 Capsules Oral (given by mouth) 2 times a day.  lisinopril 10 Milligram Oral (given by mouth) once a day (in the morning).  loratadine (Claritin 10 mg oral tablet) 1 Tabs Oral (given by mouth) once a day (in the evening) as needed as needed for allergy symptoms.         Comment:       ORDERS          Order Name Order Details   Discharge Patient 03/01/17 21:21:00 EST, Discharge Home/Self Care,  follow up tomorrow, 03/02/2017 between 0900-1300 with Dr. Clide Cliff in office.   Discharge Patient 03/01/17 21:15:00 EST, Discharge Home/Self Care

## 2017-03-01 NOTE — Op Note (Signed)
Operative Report    [facility] Staff., MD  Service Date: 03/01/2017    PREOPERATIVE DIAGNOSES:  1.  Deformity  reconstructed breast.  2.  Desires mini tummy tuck.    POSTOPERATIVE DIAGNOSES:  1.  Deformity  reconstructed breast.  2.  Desires mini tummy tuck.    PROCEDURE:  Fat grafting to breast mounds and tummy tuck.    DESCRIPTION OF PROCEDURE:  The patient was marked for the planned  procedure while in standing position preoperatively.  She was then  taken to the operating room.  General anesthesia was induced.  She was  prepped in the supine position in the usual sterile fashion.   Tumescent fluid was introduced into the intended fat donor areas of  the waist, lateral thighs, medial knees and anterior abdomen.  Fat was  harvested from these areas using a 3 mm multi-hole reciprocating  cannula and Revolve system.  An incision was then made in the lower  part of the abdomen, passing through the preexisting Pfannenstiel  scar.  Electrocautery was used to extend this downward to the  superficial fascia of the abdominal wall musculature.  Cephalad  dissection continued viewed in this plane partly to the way of the  umbilicus until the laxity of the abdominal skin could be ameliorated  by drawing the elevated tissue caudad.  The Pitt and Lahey clamp was  used to help estimate the optimal amount of vertical skin resection.   Skin was resected and stapled closed temporarily.  Additional skin was  removed until the best balance of skin tightness versus umbilical  distortion was achieved.  Additional fat was then harvested from  within the wound using the reciprocating cannula.  A closed suction  drain was placed within the abdominal wound and brought out through  the lateral most aspect of the incision where it was sutured into  position using 2-0 nylon suture.  Scarpa's fascia and the subcutaneous  tissues were closed using running Stratafix suture.  The skin was  closed using running subcuticular  Monoderm suture followed by  Dermabond.    The fat was then processed.  The yield 200 mL of purified fat graft.   The patient was placed in the semi-sitting position and the fat was  introduced into the areas of tissue deficiency at the superior and  medial aspects of the breast mounds, as well as the anterior aspect of  the breast mounds, using 10 mL syringes and the Coleman 15 cm facial  injection cannula.  A total of approximately 100 mL of fat was placed  within each breast mound.  All stab incisions were closed using  Monocryl suture followed by Dermabond.  The patient was then awakened  and taken to recovery in good condition.      Nicanor Bake., MD  TR: Theola Sequin DD: 03/01/2017 17:33 TD: 03/01/2017 19:52 Job#: [id number]  \\[id number]\\DOC#: [id number]  \\[id number]\\  Signature Line    Electronically Signed on 04/18/2017 12:29 PM EST  ________________________________________________  [facility]

## 2017-03-01 NOTE — Nursing Note (Signed)
Nursing Discharge Summary - Text       Nursing Discharge Summary Entered On:  03/01/2017 22:57 EST    Performed On:  03/01/2017 22:55 EST by COEN, RN, BEVERLY R               DC Information   Discharge To, Anticipated :   Home with family support   Home Treatments :   Other: JP drain    Transportation :   Private vehicle   Accompanied By :   Conception Chancy of Discharge :   Wheelchair   Cricket, RN, [employer] R - 03/01/2017 22:55 EST   Education   Responsible Learner(s) :   Living Situation: Home independently        Performed by: Levonne Hubert E - 03/01/2017 19:03  Discharge To: Home with family support        Performed by: Genia Del, Meriam Sprague R - 03/01/2017 20:55  Home Caregiver Name/Relationship: Neona Tallent Husband [phone]        Performed by: Noralyn Pick RN, ROBIN E - 03/01/2017 19:03     Home Caregiver Present for Session :   Yes   Barriers To Learning :   None evident   Teaching Method :   Explanation   Leanne Chang RN, BEVERLY R - 03/01/2017 22:55 EST   Post-Hospital Education Adult Grid   Activity Expectations :   Verbalizes understanding   Bladder Management :   Verbalizes understanding   Disease Process :   Verbalizes understanding   Equipment/Devices :   Verbalizes understanding   Importance of Follow-Up Visits :   Verbalizes understanding   Pain Management :   Verbalizes understanding   Plan of Care :   Verbalizes understanding   Postoperative Instructions :   Verbalizes understanding   When to Call Health Care Provider :   [facility] understanding   [place], RNAnn Held - 03/01/2017 22:55 EST   Health Maintenance Education Adult Grid   Bathing/Hygiene :   TEFL teacher understanding   Diet/Nutrition :   TEFL teacher understanding   Exercise :   Verbalizes understanding   Leanne Chang, RN, Ann Held - 03/01/2017 22:55 EST   Medication Education Adult Scientist, research (medical), Medication :   Verbalizes understanding   COEN, RN, BEVERLY R - 03/01/2017 22:55 EST   Additional Learner(s) Present :   Spouse   Time Spent Educating Patient :    30 minutes   COEN, RN, BEVERLY R - 03/01/2017 22:55 EST

## 2018-03-29 LAB — COMPREHENSIVE METABOLIC PANEL
ALT: 11 U/L (ref 0–33)
AST: 12 U/L (ref 0–32)
Albumin/Globulin Ratio: 1.91 mmol/L (ref 1.00–2.00)
Albumin: 4.4 g/dL (ref 3.5–5.2)
Alk Phosphatase: 43 U/L (ref 35–117)
Anion Gap: 11 mmol/L (ref 2–17)
BUN: 12 mg/dL (ref 6–20)
CALCIUM,CORRECTED,CCA: 9 mg/dL (ref 8.6–10.0)
CO2: 24 mmol/L (ref 22–29)
Calcium: 9.3 mg/dL (ref 8.6–10.0)
Chloride: 103 mmol/L (ref 98–107)
Creatinine: 0.6 mg/dL (ref 0.5–0.9)
GFR African American: 121 mL/min/{1.73_m2} (ref >=90)
GFR Non-African American: 105 mL/min/{1.73_m2} (ref >=90)
Globulin: 2.3 g/dL (ref 1.9–4.4)
Glucose: 89 mg/dL (ref 70–99)
OSMOLALITY CALCULATED: 275 mOsm/kg (ref 270–287)
Potassium: 4.6 mmol/L (ref 3.5–5.3)
Sodium: 138 mmol/L (ref 135–145)
Total Bilirubin: 0.3 mg/dL (ref 0.00–1.20)
Total Protein: 6.7 g/dL (ref 6.4–8.3)

## 2018-03-29 LAB — ESTRADIOL: Estradiol: 90.97 pg/mL

## 2018-03-29 LAB — CBC
Hematocrit: 40.1 % (ref 38.0–47.0)
Hemoglobin: 13.4 g/dL (ref 11.5–15.7)
MCH: 29.1 pg (ref 27.0–34.5)
MCHC: 33.4 g/dL (ref 32.0–36.0)
MCV: 87 fL (ref 81.0–99.0)
MPV: 10.5 fL (ref 7.2–13.2)
NRBC Absolute: 0 10*3/uL
NRBC Automated: 0 %
Platelets: 208 10*3/uL (ref 140–440)
RBC: 4.61 x10e6/mcL (ref 3.60–5.20)
RDW: 12.6 % (ref 11.0–16.0)
WBC: 5.6 10*3/uL (ref 3.8–10.6)

## 2018-03-29 LAB — FOLLICLE STIMULATING HORMONE: FSH: 1.83 IU/mL

## 2018-03-31 LAB — TESTOSTERONE, FREE: Testosterone,Free: 1.1 pg/mL (ref 0.0–4.2)

## 2018-04-02 LAB — ESTRONE: Estrone Serum: 54 pg/mL

## 2018-04-17 NOTE — Nursing Note (Signed)
Adult Admission Assessment - Text       Perioperative Admission Assessment Entered On:  04/17/2018 10:30 EST    Performed On:  04/17/2018 10:16 EST by Tristan Schroeder, RN, REBECCA H               General   Call Complete :   04/17/2018 16:10 EST   Height/Length Estimated :   161.2 cm(Converted to: 5 ft 3 in, 5.29 ft, 63.46 in)    Weight   Estimated :   74.8 kg(Converted to: 164 lb 14 oz, 164.906 lb)    Languages :   English   Call Start :   04/17/2018 15:56 EST     Information Given By :   Self           Primary Care Physician/Specialists :   DR. Sharlee Blew - PCP   STOKES, RN, REBECCA H - 04/17/2018 15:56 EST     Emergency Contact Name :   Francena Hanly   Emergency Contact Phone :   [phone]   STOKES, RN, REBECCA H - 04/17/2018 10:16 EST   Allergies   (As Of: 04/22/2018 11:18:12 EST)   Allergies (Active)   No Known Allergies  Estimated Onset Date:   Unspecified ; Created By:   Claria Dice; Reaction Status:   Active ; Category:   Drug ; Substance:   No Known Allergies ; Type:   Allergy ; Updated By:   Claria Dice; Reviewed Date:   04/22/2018 11:16 EST        Medication History   Medication List   (As Of: 04/22/2018 11:18:12 EST)   Normal Order    Lactated Ringers Injection solution 1,000 mL  :   Lactated Ringers Injection solution 1,000 mL ; Status:   Ordered ; Ordered As Mnemonic:   Lactated Ringers Injection 1,000 mL ; Simple Display Line:   30 mL/hr, IV ; Ordering Provider:   Purvis Sheffield; Catalog Code:   Lactated Ringers Injection ; Order Dt/Tm:   04/22/2018 10:58:05 EST ; Comment:   for patients that DO NOT have Serum Creatinine > 2.5mg /dL, End-Stage Renal Disease, Hemodialysis          Lactated Ringers Injection solution 1000 mL  :   Lactated Ringers Injection solution 1000 mL ; Status:   Ordered ; Ordered As Mnemonic:   Lactated Ringers Injection 1000 mL ; Simple Display Line:   10 mL/hr, IV ; Ordering Provider:   Purvis Sheffield; Catalog Code:   Lactated Ringers Injection ; Order  Dt/Tm:   04/17/2018 10:35:12 EST ; Comment:   Perioperative use ONLY  For Non Dialysis Patient          Sodium Chloride 0.9% intravenous solution 500 mL  :   Sodium Chloride 0.9% intravenous solution 500 mL ; Status:   Ordered ; Ordered As Mnemonic:   Sodium Chloride 0.9% 500 mL ; Simple Display Line:   15 mL/hr, IV ; Ordering Provider:   LUCAS-MD,  DAVID GRICE; Catalog Code:   Sodium Chloride 0.9% ; Order Dt/Tm:   04/22/2018 10:58:05 EST ; Comment:   For Patients with Serum Creatinine > 2.5mg /dL, End-Stage Renal Disease, Hemodialysis          Sodium Chloride 0.9% intravenous solution 500 mL  :   Sodium Chloride 0.9% intravenous solution 500 mL ; Status:   Ordered ; Ordered As Mnemonic:   Sodium Chloride 0.9% 500 mL ; Simple Display Line:   10 mL/hr, IV ;  Ordering Provider:   Purvis Sheffield; Catalog Code:   Sodium Chloride 0.9% ; Order Dt/Tm:   04/17/2018 10:35:12 EST ; Comment:   Perioperative use ONLY  For Dialysis Patient          sodium chloride 0.9% Inj Soln 10 mL syringe  :   sodium chloride 0.9% Inj Soln 10 mL syringe ; Status:   Ordered ; Ordered As Mnemonic:   sodium chloride 0.9% flush syringe range dose ; Simple Display Line:   30 mL, IV Push, q8hr ; Ordering Provider:   Devoria Albe,  DAVID GRICE; Catalog Code:   sodium chloride flush ; Order Dt/Tm:   04/22/2018 10:59:01 EST          A Patient Specific Medication  :   A Patient Specific Medication ; Status:   Ordered ; Ordered As Mnemonic:   A Patient Specific Medication ; Simple Display Line:   1 EA, Kit-Combo, q44min, PRN: other (see comment) ; Ordering Provider:   Purvis Sheffield; Catalog Code:   A Patient Specific Medication ; Order Dt/Tm:   04/22/2018 10:59:03 EST          A Patient Specific Refrigerated Medication  :   A Patient Specific Refrigerated Medication ; Status:   Ordered ; Ordered As Mnemonic:   A Patient Specific Refrigerated Medication ; Simple Display Line:   1 EA, Kit-Combo, q64min, PRN: other (see comment) ; Ordering Provider:    Purvis Sheffield; Catalog Code:   A Patient Specific Refrigerated Medicati ; Order Dt/Tm:   04/22/2018 10:59:03 EST ; Comment:   to access the patient specific Refrigerated medications          Delivery and Return [place] Access  :   Delivery and Return [place] Access ; Status:   Ordered ; Ordered As Mnemonic:   Delivery and Return Bin Access ; Simple Display Line:   1 EA, Kit-Combo, q38min, PRN: other (see comment) ; Ordering Provider:   Purvis Sheffield; Catalog Code:   Delivery and Return Bin Access ; Order Dt/Tm:   04/22/2018 10:59:03 EST ; Comment:   This code grants access to the [employer] for the Delivery and Return Bin Access          lidocaine 1% PF Inj Soln 2 mL  :   lidocaine 1% PF Inj Soln 2 mL ; Status:   Ordered ; Ordered As Mnemonic:   lidocaine 1% preservative-free injectable solution ; Simple Display Line:   0.25 mL, ID, q4min, PRN: other (see comment) ; Ordering Provider:   Purvis Sheffield; Catalog Code:   lidocaine ; Order Dt/Tm:   04/22/2018 10:59:02 EST ; Comment:   to access lidocaine 1%  2 mL vial for IV start and Life Port access          lidocaine 2% Topical Gel with applicator 10-11 mL  :   lidocaine 2% Topical Gel with applicator 10-11 mL ; Status:   Ordered ; Ordered As Mnemonic:   Uro-Jet 2% topical gel with applicator ; Simple Display Line:   1 app, Topical, q79min, PRN: other (see comment) ; Ordering Provider:   Purvis Sheffield; Catalog Code:   lidocaine topical ; Order Dt/Tm:   04/22/2018 10:59:03 EST          Respiratory MDI Treatment  :   Respiratory MDI Treatment ; Status:   Ordered ; Ordered As Mnemonic:   Respiratory MDI Treatment ; Simple Display Line:  1 EA, Kit-Combo, q14min, PRN: other (see comment) ; Ordering Provider:   Purvis Sheffield; Catalog Code:   Respiratory MDI Treatment ; Order Dt/Tm:   04/22/2018 10:59:03 EST          sodium chloride 0.9% Inj Soln 10 mL syringe  :   sodium chloride 0.9% Inj Soln 10 mL syringe ; Status:   Ordered  ; Ordered As Mnemonic:   sodium chloride 0.9% flush syringe range dose ; Simple Display Line:   30 mL, IV Push, q39min, PRN: other (see comment) ; Ordering Provider:   Purvis Sheffield; Catalog Code:   sodium chloride flush ; Order Dt/Tm:   04/22/2018 10:58:50 EST          sodium chloride 0.9% Inj Soln 10 mL vial PF  :   sodium chloride 0.9% Inj Soln 10 mL vial PF ; Status:   Ordered ; Ordered As Mnemonic:   sodium chloride 0.9% vial for reconstitution range dose ; Simple Display Line:   30 mL, IV Push, q60min, PRN: other (see comment) ; Ordering Provider:   Purvis Sheffield; Catalog Code:   sodium chloride flush ; Order Dt/Tm:   04/22/2018 10:59:02 EST ; Comment:   for access to sodium chloride 0.9% vial when needed as a diluent for reconstitutable medications          sterile water Inj Soln 10 mL  :   sterile water Inj Soln 10 mL ; Status:   Ordered ; Ordered As Mnemonic:   sterile water for reconstitution ; Simple Display Line:   10 mL, N/A, q62min, PRN: other (see comment) ; Ordering Provider:   Purvis Sheffield; Catalog Code:   sterile water for reconstitution ; Order Dt/Tm:   04/22/2018 10:59:02 EST ; Comment:   Access sterile water when needed as a diluent for reconstitutable medications. Not for IV use.          Lactated Ringers Injection IV Sol 1000 mL  :   Lactated Ringers Injection IV Sol 1000 mL ; Status:   Ordered ; Ordered As Mnemonic:   Lactated Ringer Bolus ; Simple Display Line:   500 mL, IV Bolus, Once ; Ordering Provider:   Neena Rhymes; Catalog Code:   Lactated Ringers Injection ; Order Dt/Tm:   08/31/2016 11:12:58 EDT            Home Meds    lisinopril  :   lisinopril ; Status:   Documented ; Ordered As Mnemonic:   lisinopril ; Simple Display Line:   10 mg, Oral, qAM, 0 Refill(s) ; Catalog Code:   lisinopril ; Order Dt/Tm:   08/29/2016 07:26:41 EDT          loratadine  :   loratadine ; Status:   Documented ; Ordered As Mnemonic:   Claritin 10 mg oral tablet ; Simple  Display Line:   10 mg, 1 tabs, Oral, qPM, PRN: as needed for allergy symptoms, 0 Refill(s) ; Catalog Code:   loratadine ; Order Dt/Tm:   01/27/2015 14:55:36 EST            Problem History   (As Of: 04/22/2018 11:18:12 EST)   Problems(Active)    Anesthesia (SNOMED CT  :[id number] )  Name of Problem:   Anesthesia ; Recorder:   STOKES, RN, REBECCA H; Confirmation:   Confirmed ; Classification:   Patient Stated ; Code:   [id number] ; Contributor System:   PowerChart ; Last Updated:  04/17/2018 15:59 EST ; Life Cycle Status:   Active ; Vocabulary:   SNOMED CT   ; Comments:        04/17/2018 15:59 - STOKES, RN, REBECCA H  SEVERE POST OP N&V: REQUESTS TIVA PROTOCOL.      Claustrophobia (SNOMED CT  :16109604 )  Name of Problem:   Claustrophobia ; Recorder:   Pogorzelski, RN, Orson Ape; Confirmation:   Confirmed ; Classification:   Patient Stated ; Code:   [id number] ; Contributor System:   PowerChart ; Last Updated:   08/25/2016 11:00 EDT ; Life Cycle Date:   08/25/2016 ; Life Cycle Status:   Active ; Vocabulary:   SNOMED CT   ; Comments:        08/25/2016 11:00 - Pogorzelski, RN, Orson Ape  STATES NO PROBLEM RIDING ELEVATORS      Cyst of kidney (SNOMED CT  :[id number] )  Name of Problem:   Cyst of kidney ; Recorder:   Alfonse Ras RN, Nicholos Johns; Confirmation:   Confirmed ; Classification:   Patient Stated ; Code:   [id number] ; Contributor System:   Dietitian ; Last Updated:   11/01/2015 7:57 EDT ; Life Cycle Date:   11/01/2015 ; Life Cycle Status:   Active ; Responsible Provider:   Elaina Hoops; Vocabulary:   SNOMED CT   ; Comments:        11/01/2015 7:57 - Alfonse Ras, RN, Nicholos Johns  RIGHT--SEEN ON CT--BEING FOLLOWED      Family history of breast cancer (SNOMED CT  :[id number] )  Name of Problem:   Family history of breast cancer ; Recorder:   Pogorzelski, RN, Orson Ape; Confirmation:   Confirmed ; Classification:   Patient Stated ; Code:   [id number] ; Contributor System:   PowerChart ; Last Updated:    08/25/2016 10:57 EDT ; Life Cycle Date:   08/25/2016 ; Life Cycle Status:   Active ; Vocabulary:   SNOMED CT        Family history of cancer of colon (SNOMED CT  :629528413 )  Name of Problem:   Family history of cancer of colon ; Recorder:   Ham, RN, Nicholos Johns; Confirmation:   Confirmed ; Classification:   Patient Stated ; Code:   [id number] ; Contributor System:   Dietitian ; Last Updated:   11/01/2015 7:55 EDT ; Life Cycle Date:   11/01/2015 ; Life Cycle Status:   Active ; Vocabulary:   SNOMED CT        History of basal cell carcinoma (SNOMED CT  :[phone] )  Name of Problem:   History of basal cell carcinoma ; Recorder:   STOKES, RN, REBECCA H; Confirmation:   Confirmed ; Classification:   Patient Stated ; Code:   [id number] ; Contributor System:   Dietitian ; Last Updated:   05/11/2016 13:11 EST ; Life Cycle Date:   05/11/2016 ; Life Cycle Status:   Active ; Vocabulary:   SNOMED CT        Hypertension (SNOMED CT  :6433295188 )  Name of Problem:   Hypertension ; Recorder:   Pogorzelski, RN, Orson Ape; Confirmation:   Confirmed ; Classification:   Patient Stated ; Code:   [id number] ; Contributor System:   Dietitian ; Last Updated:   08/25/2016 10:57 EDT ; Life Cycle Date:   08/25/2016 ; Life Cycle Status:   Active ; Vocabulary:   SNOMED CT        Post-operative nausea and vomiting (SNOMED CT  :0109323 )  Name  of Problem:   Post-operative nausea and vomiting ; Recorder:   Nedra Hai RN, Villa Herb; Confirmation:   Confirmed ; Classification:   Patient Stated ; Code:   1610960 ; Contributor System:   PowerChart ; Last Updated:   01/27/2015 14:57 EST ; Life Cycle Date:   01/27/2015 ; Life Cycle Status:   Active ; Vocabulary:   SNOMED CT        Seasonal allergies (SNOMED CT  :[phone] )  Name of Problem:   Seasonal allergies ; Recorder:   Nedra Hai, RN, Villa Herb; Confirmation:   Confirmed ; Classification:   Patient Stated ; Code:   [phone] ; Contributor System:   PowerChart ;  Last Updated:   01/27/2015 14:57 EST ; Life Cycle Date:   01/27/2015 ; Life Cycle Status:   Active ; Vocabulary:   SNOMED CT          Procedure History        -    Procedure History   (As Of: 04/22/2018 11:18:12 EST)     Anesthesia Minutes:   0 ; Procedure Name:   Excision of cyst of breast ; Procedure Minutes:   0 ; Comments:     11/01/2015 7:58 EDT - Alfonse Ras, RN, Nicholos Johns  RIGHT ; Last Reviewed Dt/Tm:   04/22/2018 11:17:35 EST            Procedure Dt/Tm:   01/28/2015 12:33:00 EST ; Location:   [facility] ; Provider:   Josiah Lobo A.-MD; Anesthesia Type:   Monitored Anesthesia Care ; :   Starleen Arms T-MD; Anesthesia Minutes:   0 ; Procedure Name:   Wrist Ganglion Cyst Excision ; Procedure Minutes:   29 ; Comments:     01/28/2015 13:11 EST - Leatha Gilding, RN, DOROTHY  auto-populated from documented surgical case ; Clinical Service:   Surgery ; Last Reviewed Dt/Tm:   04/22/2018 11:17:35 EST            Anesthesia Minutes:   0 ; Procedure Name:   Cyst of eyelid of left eye ; Procedure Minutes:   0 ; Last Reviewed Dt/Tm:   04/22/2018 11:17:35 EST            Anesthesia Minutes:   0 ; Procedure Name:   Sling procedure of bladder neck ; Procedure Minutes:   0 ; Last Reviewed Dt/Tm:   04/22/2018 11:17:35 EST            Anesthesia Minutes:   0 ; Procedure Name:   Arthroscopy of knee ; Procedure Minutes:   0 ; Comments:     01/27/2015 15:00 EST - Kennieth Rad  RIGHT ; Last Reviewed Dt/Tm:   04/22/2018 11:17:35 EST            Anesthesia Minutes:   0 ; Procedure Name:   Hysterectomy ; Procedure Minutes:   0 ; Comments:     05/11/2016 12:58 EST - Tristan Schroeder, RN, REBECCA H  TAH ; Last Reviewed Dt/Tm:   04/22/2018 11:17:35 EST            Procedure Dt/Tm:   11/01/2015 08:43:00 EDT ; Location:   [facility] ; Provider:   Ronnell Freshwater D; Anesthesia Type:   Monitored Anesthesia Care ; :   Georgiana Shore; Anesthesia Minutes:   0 ; Procedure Name:   Colonoscopy with Polyp Cold ; Procedure Minutes:   15 ; Comments:     11/01/2015 8:59 EDT -  PRICE,  MICHELLE A-RN  auto-populated from documented surgical case ; Clinical Service:   Surgery ; Last Reviewed Dt/Tm:   04/22/2018 11:17:35 EST            Procedure Dt/Tm:   02/15/2016 14:09:00 EST ; Location:   [facility] ; Provider:   Neena Rhymes; Anesthesia Type:   General ; :   PHELPS-MD,  SARAH; Anesthesia Minutes:   0 ; Procedure Name:   Breast Reduction (Bilateral) ; Procedure Minutes:   165 ; Comments:     02/15/2016 17:03 EST - Louis Matte H  auto-populated from documented surgical case ; Clinical Service:   Surgery ; Last Reviewed Dt/Tm:   04/22/2018 11:17:35 EST            Procedure Dt/Tm:   05/15/2016 08:35:00 EST ; Location:   [facility] ; Provider:   Rogelia Rohrer; Anesthesia Type:   General ; :   Estelle Grumbles; Anesthesia Minutes:   0 ; Procedure Name:   Mastectomy Bilateral (Bilateral) ; Procedure Minutes:   113 ; Comments:     05/15/2016 13:06 EST - Jess Barters, RN, Corinna Capra  auto-populated from documented surgical case ; Clinical Service:   Surgery ; Last Reviewed Dt/Tm:   04/22/2018 11:17:35 EST            Procedure Dt/Tm:   05/15/2016 08:35:00 EST ; Location:   [facility] ; Provider:   Marcine Matar; Anesthesia Type:   General ; :   BARTLETT-MD,  Andres Ege; Anesthesia Minutes:   0 ; Procedure Name:   Autologous Injection (Left) ; Procedure Minutes:   5 ; Comments:     05/15/2016 13:06 EST - Jess Barters, RN, Corinna Capra  auto-populated from documented surgical case ; Clinical Service:   Surgery ; Last Reviewed Dt/Tm:   04/22/2018 11:17:35 EST            Procedure Dt/Tm:   05/15/2016 08:35:00 EST ; Location:   [facility] ; Provider:   Neena Rhymes; Anesthesia Type:   General ; :   Estelle Grumbles; Anesthesia Minutes:   0 ; Procedure Name:   Breast Reconstruction with Implant or Tissue Expander (Bilateral) ; Procedure Minutes:   126 ; Comments:     05/15/2016 13:06 EST - Jess Barters, RN, Corinna Capra  auto-populated from documented surgical case ; Clinical Service:   Surgery ; Last  Reviewed Dt/Tm:   04/22/2018 11:17:35 EST            Procedure Dt/Tm:   08/31/2016 08:58:00 EDT ; Location:   [facility] ; Provider:   Neena Rhymes; Anesthesia Type:   General ; :   Helyn Numbers,  JEFFREY DEAN; Anesthesia Minutes:   0 ; Procedure Name:   Breast Implant Exchange (Bilateral) ; Procedure Minutes:   122 ; Comments:     08/31/2016 11:17 EDT - Lorelle Formosa, RN, Dairl Ponder  auto-populated from documented surgical case ; Clinical Service:   Surgery ; Last Reviewed Dt/Tm:   04/22/2018 11:17:35 EST            Procedure Dt/Tm:   08/31/2016 08:58:00 EDT ; Location:   [facility] ; Provider:   Neena Rhymes; Anesthesia Type:   General ; :   Helyn Numbers,  JEFFREY DEAN; Anesthesia Minutes:   0 ; Procedure Name:   Fat Transfer (site) (Abdomen, Face) ; Procedure Minutes:   40 ; Comments:     08/31/2016 11:17 EDT - Lorelle Formosa, RN, Dairl Ponder  auto-populated from documented surgical case ; Clinical Service:   Surgery ; Last Reviewed Dt/Tm:   04/22/2018 11:17:35 EST            Procedure Dt/Tm:   11/27/2016 08:52:00 EDT ; Location:   [facility] ; Provider:   Neena Rhymes; Anesthesia Type:   General ; :   LUKE-MD,  JOSHUA EUGENE; Anesthesia Minutes:   0 ; Procedure Name:   Breast Revision with Fat Transfer (Bilateral) ; Procedure Minutes:   188 ; Comments:     11/27/2016 12:20 EDT - Maxwell Caul, RN, KRISTIN Y  auto-populated from documented surgical case ; Clinical Service:   Surgery ; Last Reviewed Dt/Tm:   04/22/2018 11:17:35 EST            Procedure Dt/Tm:   03/01/2017 14:48:00 EST ; Location:   [facility] ; Provider:   Neena Rhymes; Anesthesia Type:   General ; Anesthesia Minutes:   0 ; Procedure Name:   Abdominoplasty (Possible) ; Procedure Minutes:   152 ; Comments:     03/01/2017 17:28 EST - Celine Ahr, RN, Darl Pikes  auto-populated from documented surgical case ; Clinical Service:   Surgery ; Last Reviewed Dt/Tm:   04/22/2018 11:17:35 EST            Procedure Dt/Tm:   03/01/2017 14:48:00 EST ; Location:   [facility] ;  Provider:   Neena Rhymes; Anesthesia Type:   General ; Anesthesia Minutes:   0 ; Procedure Name:   Fat Transfer (site) (Bilateral) ; Procedure Minutes:   152 ; Comments:     03/01/2017 17:28 EST - Celine Ahr, RN, Darl Pikes  auto-populated from documented surgical case ; Clinical Service:   Surgery ; Last Reviewed Dt/Tm:   04/22/2018 11:17:35 EST            Anesthesia Minutes:   0 ; Procedure Name:   TAH - Total abdominal hysterectomy ; Procedure Minutes:   0 ; Last Reviewed Dt/Tm:   04/22/2018 11:17:35 EST            History Confirmation   Problem History Changes PAT :   No   Procedure History Changes PAT :   No   STINSON, RN, SHELLEY P - 04/22/2018 11:15 EST   Anesthesia/Sedation   Anesthesia History :   Prior general anesthesia   Previous Problem with Anesthesia :   Other: TIVA PROTOCOL   Opioid Exposure :   Opioid Naive   Moderate Sedation History :   Prior sedation for procedure   Symptoms of Sleep Apnea :   Age greater than 50, Hypertension   Symptoms of Sleep Apnea Score :   2    Shortness of Breath Indicator :   No shortness of breath   Pregnancy Status :   Patient denies   STOKES, RN, REBECCA H - 04/17/2018 15:56 EST   Bloodless Medicine   Will Patient Accept Blood Transfusion and/or Blood Products :   Yes   STOKES, RN, REBECCA H - 04/17/2018 15:56 EST   ID Risk Screen Symptoms   Recent Travel History :   No recent travel   TB Symptom Screen :   No symptoms   C. diff Symptom/History ID :   Neither of the above   Tristan Schroeder, RN, REBECCA H - 04/17/2018 15:56 EST   Social History   Social History   (As Of: 04/22/2018 11:18:12 EST)   Tobacco:        Never smoker   (  Last Updated: 08/25/2016 10:32:36 EDT by Melida Quitter, RN, Orson Ape)          Alcohol:        Current, Beer, Liquor, 3-5 times per week   (Last Updated: 08/25/2016 10:32:54 EDT by Melida Quitter, RN, Orson Ape)          Substance Abuse:        Denies   (Last Updated: 08/25/2016 10:33:00 EDT by Melida Quitter, RN, Orson Ape)            Advance Directive   Advance  Directive :   No   STOKES, RN, REBECCA H - 04/17/2018 15:56 EST   PAT Patient Instructions   Patient Arrival Time PAT :   04/22/2018 11:00 EST   Medication Understanding :   Verbalizes understanding   NPO PAT :   NPO after midnight   STOKES, RN, REBECCA H - 04/17/2018 15:56 EST   PAT Instructions Grid   Make up Understanding :   Verbalizes understanding   Jewelry Understanding :   Verbalizes understanding   Perfume Understanding :   Verbalizes understanding   Valuables Understanding :   Verbalizes understanding   Clothing Understanding :   Verbalizes understanding   Contact MD for Illness :   Verbalizes understanding   Contact MD for skin injury :   Verbalizes understanding   STOKES, RN, REBECCA H - 04/17/2018 15:56 EST   Service Line PAT :   Gen Surg   Laterality PAT :   N/A   Name of Contact PAT :   CARL Nhan   Relationship of Contact PAT :   Marine scientist PAT :   Accompany to Hospital, Transport Home, Remain with 24 hours post-procedure   STOKES, RN, REBECCA H - 04/17/2018 15:56 EST   Harm Screen   Injuries/Abuse/Neglect in Household :   Denies   Feels Unsafe at Home :   No   Last 3 mo, thoughts killing self/others :   Patient denies   Adrian Blackwater RN, SHELLEY P - 04/22/2018 11:15 EST

## 2018-04-22 NOTE — Op Note (Signed)
Phase I Record - [facility]             Phase I Record - [facility] Summary                                                                   Primary Physician:        Purvis Sheffield    Case Number:              [id number]    Finalized Date/Time:      04/22/18 14:08:08    Pt. Name:                 [name] [name], [name] [name]    D.O.B./Sex:               Sep 19, 1965    Female    Med Rec #:                [id number]    Physician:                Purvis Sheffield    Financial #:              [id number]    Pt. Type:                 S    Room/Bed:                 /    Admit/Disch:              04/22/18 10:48:00 -    Institution:       [facility] Case Attendance - Phase I                                                                                            Entry 1                         Entry 2                                                                          Case Attendee             LUCAS-MD,  DAVID Vic Blackbird, RN, RACHEL C    Role Performed            Surgeon Primary                 Post Anesthesia Care  Nurse    Time In                                                   04/22/18 13:38:00    Time Out     Last Modified By:         Nira Conn RN, Vernell Leep, RN, RACHEL C                              04/22/18 13:41:30               04/22/18 13:41:41      [facility] Case Attendance - Phase I Audit                                                             04/22/18 13:41:41         Owner: Buddy Duty                               Modifier: [email or web address]                                                        <+> 2         Case Attendee        <+> 2         Role Performed        <+> 2         Time In        [facility] Case Times - Phase I                                                                                                 Entry 1                                                                                                          Phase I In                 04/22/18 13:38:00  Phase I Out                     04/22/18 14:08:00    Phase I Discharge         04/22/18 14:08:00    Time     Last Modified By:         Nira Conn RN, RACHEL C                              04/22/18 14:08:04      [facility] Case Times - Phase I Audit                                                                  04/22/18 14:08:04         Owner: Buddy Duty                               Modifier: [email or web address]                                                        <+> 1         Phase I Out        <+> 1         Phase I Discharge Time                Finalized By: Jerilynn Birkenhead C      Document Signatures                                                                             Signed By:           Nira Conn RN, RACHEL C 04/22/18 14:08

## 2018-04-22 NOTE — Anesthesia Post-Procedure Evaluation (Signed)
post anes note PACU        Patient:   [name] [name], [name] [name]             MRN: [id number]            FIN: [phone]               Age:   53 years     Sex:  Female     DOB:  1965/08/03   Associated Diagnoses:   None   Author:   Wyvonne Lenz      Postoperative Information   Post Operative Info:          Post operative day: Post Anesthesia Care Unit.         Patient location: PACU.       Assessment   Postanesthesia assessment   Vitals: stable--see anesthesia record.     Respiratory function: Respiratory rate, airway, and oxygen saturation are at adequate levels.     Cardiovascular function: Heart Rate stable, Blood Pressure stable, Postoperative hydration status Adequate.     Mental status: appropriate for level of anesthesia.     Temperature: within normal limits.     Pain Control: Adequate.     Nausea/Vomiting: Absent.     Signature Line     Electronically Signed on 04/22/2018 01:47 PM EST   ________________________________________________   Wyvonne Lenz

## 2018-04-22 NOTE — Nursing Note (Signed)
Medication Administration Follow Up-Text       Medication Administration Follow Up Entered On:  04/22/2018 16:40 EST    Performed On:  04/22/2018 16:12 EST by Maisie Fus, RN, BRITNEY O      Intervention Information:     promethazine  Performed by Nira Conn, RN, RACHEL C on 04/22/2018 13:52:00 EST       promethazine,12.5mg   IV Push,Antecubital, Right,nausea/vomiting       Med Response   ED Medication Response :   Symptoms improved   Pasero Opioid Induced Sedation Scale :   1 = Awake and alert   Respiratory Rate :   [address] br/min   [place], RN, Darl Householder - 04/22/2018 16:32 EST

## 2018-04-22 NOTE — Op Note (Signed)
Operative Report    St. Ermalene Searing, MD  Service Date: 04/22/2018    ATTENDING SURGEON:  Dr. Mickle Mallory.    ANESTHETIST:  Dr. Bernell List.    PROCEDURE:  Laparoscopic cholecystectomy.    PREOPERATIVE DIAGNOSIS:  Gallbladder polyp.    POSTOPERATIVE DIAGNOSIS:  Symptomatic cholelithiasis.    INDICATIONS FOR PROCEDURE:  [name] [name] developed symptoms of biliary colic  and was found to have a gallbladder polyp.  DESCRIPTION OF PROCEDURE:  She was taken to the operating room, placed  supine on the operating room table where general anesthesia was  induced.  Her abdomen was prepped and draped in standard aseptic  fashion and entered at the umbilicus using a Veress needle followed by  the 11 mm trocar.  Three 5 mm trocars were placed across the upper  abdomen and the dome of the gallbladder was grasped and elevated.   [name]'s pouch was retracted laterally and inferiorly as the cystic  duct and cystic artery were carefully skeletonized and then divided  between Hemoclips after the critical view of safety had been achieved.   The remainder of the gallbladder was dissected free from the liver  using electrocautery.  We did not spill any bile.  The gallbladder was  removed through the umbilical trocar site without difficulty.  The  upper abdomen was inspected to verify hemostasis and there was no sign  of bile leak.  The trocars were removed and the fascia at the  umbilical trocar site was closed with interrupted 0 Vicryl sutures.   The skin incisions were closed with subcuticular Monocryl sutures and  Steri-Strips.  Dressings were applied and she was extubated and taken  to recovery room in stable condition.  Estimated blood loss was  minimal.  Please note that I opened the gallbladder on the back table  and she did have some small stones.      [name], MD  TR: mn DD: 04/22/2018 13:36 TD: 04/22/2018 13:58 Job#: [id number]  \\[id number]\\DOC#: [id number]  \\[id number]\\      cc:  Sharlee Blew MD  Signature  Line    Electronically Signed on 04/22/2018 02:09 PM EST  ________________________________________________  Devoria Albe,   GRICE

## 2018-04-22 NOTE — Nursing Note (Signed)
Medication Administration Follow Up-Text       Medication Administration Follow Up Entered On:  04/22/2018 17:58 EST    Performed On:  04/22/2018 18:09 EST by Maisie Fus, RN, BRITNEY O      Intervention Information:     acetaminophen  Performed by Maisie Fus, RN, BRITNEY O on 04/22/2018 17:09:00 EST       acetaminophen,650mg   Oral,mild pain (1-3)       Med Response   ED Medication Response :   Symptoms improved   Numeric Rating Pain Scale :   0 = No pain   Pasero Opioid Induced Sedation Scale :   1 = Awake and alert   Respiratory Rate :   17 br/min   THOMAS, RN, BRITNEY O - 04/22/2018 17:58 EST

## 2018-04-22 NOTE — Procedures (Signed)
IntraOp Record - [facility]             IntraOp Record - [facility] Summary                                                                   Primary Physician:        Purvis Sheffield    Case Number:              [id number]    Finalized Date/Time:      04/22/18 13:39:31    Pt. Name:                 [name] [name], [name] [name]    D.O.B./Sex:               01/29/1966    Female    Med Rec #:                [id number]    Physician:                Purvis Sheffield    Financial #:              [id number]    Pt. Type:                 S    Room/Bed:                 /    Admit/Disch:              04/22/18 10:48:00 -    Institution:       [facility] - Case Times                                                                                                         Entry 1                                                                                                          Patient      In Room Time             04/22/18 13:00:00               Out Room Time                   04/22/18 13:37:00    Anesthesia     Procedure  Start Time               04/22/18 13:13:00               Stop Time                       04/22/18 13:34:00    Last Modified By:         Drue Novel                              04/22/18 13:37:10      SFOR - Case Times Audit                                                                          04/22/18 13:37:10         Owner: [id number]                              Modifier: [id number]                                                       <+> 1         Out Room Time        <+> 1         Stop Time        [facility] - Case Attendance                                                                                                    Entry 1                         Entry 2                         Entry 3                                          Case Attendee             PHELPS-MD,  SARAH               HODGE-CRNA,  JESSICA N          LUCAS-MD,  DAVID GRICE    Role Performed            Anesthesiologist  CRNA                             Surgeon Primary    Time In                   04/22/18 13:00:00               04/22/18 13:00:00               04/22/18 13:00:00    Time Out                  04/22/18 13:37:00               04/22/18 13:37:00               04/22/18 13:37:00    Procedure                 Cholecystectomy                 Cholecystectomy                 Cholecystectomy                              Laparoscopic                    Laparoscopic                    Laparoscopic    Last Modified By:         Melissa Montane RN, Luther Redo, RN, Luther Redo, RNFaye Ramsay                              04/22/18 47:82:95               04/22/18 13:39:27               04/22/18 13:39:27                                Entry 4                         Entry 5                         Entry 6                                          Case Attendee             Melissa Montane, RN, Joaquin Bend,  REGINA M                 PENN,  Winton B    Role Performed            Circulator                      Surgical Scrub  Circulator Add'l    Time In                   04/22/18 13:00:00               04/22/18 13:00:00               04/22/18 13:00:00    Time Out                  04/22/18 13:37:00               04/22/18 13:37:00               04/22/18 13:37:00    Procedure                 Cholecystectomy                 Cholecystectomy                 Cholecystectomy                              Laparoscopic                    Laparoscopic                    Laparoscopic    Last Modified By:         Melissa Montane RN, Luther Redo, RN, Luther Redo, RNFaye Ramsay                              04/22/18 13:39:27               04/22/18 13:39:27               04/22/18 13:39:27      SFOR - Case Attendance Audit                                                                     04/22/18 13:39:27         Owner: [id number]                              Modifier: [id number]                                                            1     <+> Time Out            1     <*> Procedure                              Cholecystectomy Laparoscopic            2     <+>  Time Out            2     <*> Procedure                              Cholecystectomy Laparoscopic            3     <+> Time Out            3     <*> Procedure                              Cholecystectomy Laparoscopic            4     <+> Time Out            4     <*> Procedure                              Cholecystectomy Laparoscopic            5     <+> Time Out            5     <*> Procedure                              Cholecystectomy Laparoscopic            6     <+> Time Out            6     <*> Procedure                              Cholecystectomy Laparoscopic     04/22/18 13:21:24         Owner: [id number]                              Modifier: [id number]                                                           1     <*> Procedure                              Cholecystectomy Laparoscopic            2     <*> Procedure                              Cholecystectomy Laparoscopic            3     <*> Procedure                              Cholecystectomy Laparoscopic            4     <+> Time In            4     <*>  Procedure                              Cholecystectomy Laparoscopic            5     <+> Time In            5     <*> Procedure                              Cholecystectomy Laparoscopic            6     <+> Time In            6     <*> Procedure                              Cholecystectomy Laparoscopic     04/22/18 13:15:44         Owner: [id number]                              Modifier: [id number]                                                           1     <+> Time In            1     <*> Procedure                              Cholecystectomy Laparoscopic            2     <+> Time In            2     <*> Procedure                              Cholecystectomy Laparoscopic            3     <+> Time In            3     <*> Procedure                               Cholecystectomy Laparoscopic        <+> 4         Case Attendee        <+> 4         Role Performed        <+> 4         Procedure        <+> 5         Case Attendee        <+> 5         Role Performed        <+> 5         Procedure        <+> 6         Case Attendee        <+>  6         Role Performed        <+> 6         Procedure        SFOR - Skin Assessment                                                                          Pre-Care Text:            A.240 Assesses baseline skin condition Im.120 Implements protective measures to prevent skin or tissue injury           due to mechanical sources  Im.280.1 Implements progective measures to prevent skin or tissue injury due to           thermal sources Im.360 Monitors for signs and symptons of infection                              Entry 1                                                                                                          Skin Integrity            Intact    Last Modified By:         Melissa Montane, RN, Faye Ramsay                              04/22/18 13:17:43    Post-Care Text:            E.10 Evaluates for signs and symptoms of physical injury to skin and tissue E.270 Evaluate tissue perfusion           O.60 Patient is free from signs and symptoms of injury caused by extraneous objects   O.210 Patinet's tissue           perfusion is consistent with or improved from baseline levels      SFOR - Patient Positioning                                                                      Pre-Care Text:            A.240 Assesses baseline skin condition A.280 Identifies baseline musculoskeletal status A.280.1 Identifies           physical alterations that require additional precautions for procedure-specific positioning A.510.8 Maintains  patient's dignity and privacy Im.120 Implements protective measures to prevent skin/tissue injury due to           mechanical sources Im.40 Positions the patient Im.80 Applies safety devices                               Entry 1                                                                                                          Procedure                 Cholecystectomy                 Body Position                   Supine                              Laparoscopic    Left Arm Position         Flexed on Padded Arm            Right Arm Position              Extended on Padded Arm                              Board w/Security Strap                                          Board w/Security Strap    Left Leg Position         Extended Security Strap         Right Leg Position              Extended Security Strap    Feet Uncrossed            Yes                             Pressure Points                 Yes                                                              Checked     Positioning Device        Pillow, Heel pad                Positioned By  HODGE-CRNA,  JESSICA N,                                                                                              PENN,  KISHA B    Outcome Met (O.80)        Yes    Last Modified By:         Melissa Montane, RN, Faye Ramsay                              04/22/18 13:16:54    Post-Care Text:            E.10 Evaluates for signs and symptoms of physical injury to skin and tissue E.290 Evaluates musculoskeletal           status O.80 Patient is free from signs and symptoms of injury related to positioning O.120 the patient is free           from signs and symptoms of injury related to transfer/transport  O.250 Patient's musculoskeletal status is           maintained at or improved from baseline levels      SFOR - Skin Prep                                                                                Pre-Care Text:            A.30 Verifies allergies A.20 Verifies procedure, surgical site, and laterality A.510.8 Maintains paritnet's           dignity and privacy Im.270 Performs Skin Preparation Im.270.1 Implements protective measures to prevent skin           and tissue injury due to  chemical sources  A.300.1 Protects from cross-contamination                              Entry 1                                                                                                          Hair Removal     Skin Prep      Prep Agents (Im.270)     Chlorhexidine Gluconate         Prep Area (Im.270)  Abdomen                              2% w/Alcohol     Prep By                  Andrey Farmer B    Outcome Met (O.100)       Yes    Last Modified By:         Melissa Montane, RN, Faye Ramsay                              04/22/18 13:16:40    Post-Care Text:            E.10 Evaluates for signs and symptoms of physical injury to skin and tissue O.100 Patient is free from signs           and symptoms of chemical injury  O.740 The patient's right to privacy is maintained      SFOR - Counts Initial and Final                                                                 Pre-Care Text:            A.20 Verifies operative procedure, sugical site, and laterality A.20.2 Assesses the risk for unintended           retained foreign body Im.20 Performs required counts                              Entry 1                                                                                                          Initial Counts      Initial Counts           Bouton, RN, Faye Ramsay,           Items included in               Instruments, Sponges,     Performed By             Georganna Skeans, PENN,          the Initial Count               Sharps                              KISHA B    Final Counts      Final Counts             SHAW,  REGINA M,  Final Count Status              Correct     Performed By             Melissa Montane, RN, Faye Ramsay     Items Included in        Sponges, Sharps     Final Count     Outcome Met (O.20)        Yes    Last Modified By:         Melissa Montane RN, Faye Ramsay                              04/22/18 13:18:04    Post-Care Text:            E.50 Evaluates results of the surgical count O.20 Patient is free from  unintended retained foreign objects      SFOR - General Case Data                                                                        Pre-Care Text:            A.350.1 Classifies surgical wound                              Entry 1                                                                                                          Case Information      ASA Class                2                               Case Level                      Level 3     OR                       SF 02                           Specialty                       General (SN)     Wound Class              2-Clean-Contaminated    Preop Diagnosis           GALLBLADDER POLYP  Postop Diagnosis                GALLBLADDER POLYP    Last Modified By:         Drue Novel                              04/22/18 13:15:52    Post-Care Text:            O.760 Patient receives consistent and comparable care regardless of the setting      SFOR - Fire Risk Assessment                                                                                               Entry 1                                                                                                          Fire Risk                 Alcohol Based Prep              Fire Risk Score                 2    Assessment: If            Solution, Ignition    checked, checkmark        Source In Use    = 1 point     Last Modified By:         Melissa Montane, RN, Faye Ramsay                              04/22/18 13:18:24      SFOR - Time Out                                                                                 Pre-Care Text:            A.10 Confirms patient identity A.20 Verifies operative procedure, surgical site, and laterality A.20.1 Verifies           consent for planned procedure A.30 Verifies allergies  Entry 1                                                                                                          Procedure                  Cholecystectomy                 Is everyone ready               Yes                              Laparoscopic                    to perform time out     Have all members of       Yes                             Patient name and                Yes    the surgical team                                         DOB confirmed     been introduced     Allergies discussed       Yes                             Surgical procedure              Yes                                                              to be performed                                                               confirmed and                                                               verified by  completed surgical                                                               consent     Correct surgical          Yes                             Correct laterality              Yes    site marked and                                           confirmed     initials are     visible through     prepped and draped     field (or     alternative ID band     used)     Correct patient           Yes                             Surgeon shares                  Yes    position confirmed                                        operative plan,                                                               possible                                                               difficulties,                                                               expected duration,                                                               anticipated blood  loss and reviews                                                               all                                                               critical/specific                                                               concerns     Required blood            Yes                             Essential  imaging               Yes    products, implants,                                       available and fetal     devices and/or                                            heartones confirmed     special equipment                                         (if applicable)     available and     sterility confirmed     VTE prophylaxis           Yes                             Antibiotics ordered             Yes    addressed                                                 and administered     Anesthesia shares         Yes                             Fire risk                       Yes    anesthetic plan  and                                       assessment scored     reviews patient                                           and plan discussed     specific concerns     Appropriate drying        Yes                             Surgeon states:                 Yes    time for prep                                             Does anyone have     observed before                                           any concerns? If     draping                                                   you see, suspect,                                                               or feel that                                                               patient care is                                                               being compromised,                                                               speak up for  patient safety     Time Out Complete         04/22/18 13:11:00    Last Modified By:         Drue Novel                              04/22/18 13:17:14    Post-Care Text:            E.30 Evaluates verification process for correct patient, site, side, and level surgery      SFOR - Debrief                                                                                  Pre-Care Text:            Im.330 Manages specimen handling and disposition                               Entry 1                                                                                                          Procedure                 Cholecystectomy                 Final counts                    Yes                              Laparoscopic                    correct and                                                               verbally verified                                                               with  surgeon/licensed                                                               independent                                                               practitioner (if                                                               applicable)     Actual procedure          Yes                             Postop diagnosis                Yes    performed confirmed                                       confirmed     Wound                     Yes                             Confirm specimens               Yes    classification                                            and specimens     confirmed                                                 labeled                                                               appropriately (if                                                               applicable)  Foley catheter            Yes                             Patient recovery                Yes    removed (if                                               plan confirmed     applicable)     Debrief Complete          04/22/18 13:31:00    Last Modified By:         Drue Novel                              04/22/18 13:37:17    Post-Care Text:            E.800 Ensures continuity of care E.50 Evaluates results of the surgical count O.30 Patient's procedure is           performed on the correct site, side, and level O.50 patient's current status is communicated throughout the           continuum of care O.40 Patient's specimen(s) is  managed in the appropriate manner      SFOR - Debrief Audit                                                                             04/22/18 13:37:17         Owner: [id number]                              Modifier: [id number]                                                           1     <*> Procedure                              Cholecystectomy Laparoscopic            1     <+> Debrief Complete        SFOR - Cautery                                                                                  Pre-Care  Text:            A.240 Assesses baseline skin condition A280.1 Identifies baseline musculoskeletal status Im.50 Implements           protective measures to prevent injury due to electrical sources  Im.60 Uses supplies and equipment within safe           parameters Im.80 Applies safety devices                              Entry 1                                                                                                          ESU Type                  GENERATOR                       Identification                  [phone]                              [employer]              Number     Coag Setting (watts)      40                              Cut Setting (watts)             40    Grounding Pad             Yes                             Grounding Pad Site              Thigh, left    Needed?     Grounding Stacy Gardner, RN, [employer]            Outcome Met (O.10)              Yes    Applied By     Last Modified By:         Melissa Montane RN, Faye Ramsay                              04/22/18 13:16:26    Post-Care Text:            E.10 Evaluates for signs and symptoms of physical injury to skin and tissue O.10 Patient is free from signs and           symptoms of injury related to thermal sources  O.70 Patient is free from signs and symptoms  of electrical injury      SFOR - Patient Care Devices                                                                     Pre-Care Text:            A.200 Assesses risk for  normothermia regulation A.40 Verifies presence of prosthetics or corrective devices           Im.280 Implements thermoregulation measures Im.60 Uses supplies and equipment within safe parameters                              Entry 1                                                                                                          Equipment Type            MACHINE SEQUENTIAL              SCD Sleeve Site                 Legs Bilateral                              COMPRESSION    Equipment/Tag Number      [id number]                          Initiated Pre                   Yes                                                              Induction     Last Modified By:         Melissa Montane, RN, Faye Ramsay                              04/22/18 13:17:22    Post-Care Text:            E.10 Evaluates signs and symptoms of physical injury to skin and tissue O.60 Patient is free from signs and           symptoms of injury caused by extraneous objects      SFOR - Medications  Pre-Care Text:            A.10 Confirms patient identity A.30 Verifies allergies Im.220 Administers prescribed medications Im.220.2           Administers prescribed antibiotic therapy as ordered                              Entry 1                                                                                                          Medication                INACTIVE ITEM ***               Route of Admin                  Local Injection                              zzBUPIVACAINE HCL 0.5%                              INJECTION 0.5%    Dose/Volume               10ml                            Site                            Abdomen    (include amount and     unit of measure)     Administered By           LUCAS-MD,  DAVID GRICE          Outcome Met (O.130)             Yes    Last Modified By:         Drue Novel                              04/22/18 13:37:06    Post-Care Text:             E.20 Evaluates response to medications O.130 Patient receives appropriately administerd medication(s)      SFOR - Medications Audit                                                                         04/22/18 13:37:06         Owner: [id number]  Modifier: [id number]                                                       <+> 1         Dose/Volume (include amount and                      unit of measure)        SFOR - Specimens                                                                                                          Entry 1                                                                                                          Description               Gallbladder                     Specimen Type                   Routine    Last Modified By:         Drue Novel                              04/22/18 13:17:32      SFOR - Dressing/Packing                                                                         Pre-Care Text:            A.350 Assesses susceptibility for infection Im.250 Administers care to invasive devices Im.290 Administer care           to wound sites  Im.300 Implements aseptic technique                              Entry 1  Site                      Abdomen    Dressing Item     Details      Dressing Item            Wound Closure Strip,     (Im.290)                 Occlusive Dressing    Last Modified By:         Melissa Montane, RN, Faye Ramsay                              04/22/18 13:16:34    Post-Care Text:            E.320 Evaluate factors associted with increased risk for postoperative infection at the completion of the           procedure O.200 Patient's wound perfusion is consistent with or improved from baseline levels  O.Patient is           free from signs and symptoms of infection      [facility] - Procedures                                                                                Pre-Care Text:            A.20 Verifies operative procedure, surgical site, and laterality Im.150 Develops individualized plan of care                              Entry 1                                                                                                          Procedure     Description      Procedure                Cholecystectomy                 Surgical Procedure              LAP CHOLE                              Laparoscopic                    Text     Primary Procedure         Yes  Primary Surgeon                 Elana Alm [facility]    Start                     04/22/18 13:13:00               Stop                            04/22/18 13:34:00    Anesthesia Type           General                         Surgical Service                General (SN)    Wound Class               2-Clean-Contaminated    Last Modified By:         Drue Novel                              04/22/18 13:37:11    Post-Care Text:            O.730 The patient's care is consistent with the individualized perioperative plan of care      [facility] - Procedures Audit                                                                          04/22/18 13:37:11         Owner: [id number]                              Modifier: [id number]                                                       <+> 1         Stop        [facility] - Transfer                                                                                                           Entry 1  Transferred By            Melissa Montane, RN, Faye Ramsay,           Via                             Stretcher                              HODGE-CRNA,  JESSICA N    Post-op Destination       PACU    Skin Assessment      Condition                Intact    Last Modified By:         Drue Novel                              04/22/18  13:18:40      Case Comments                                                                                         <None>              Finalized By: Drue Novel      Document Signatures                                                                             Signed By:           Drue Novel 04/22/18 13:39

## 2018-04-22 NOTE — Nursing Note (Signed)
Medication Administration Follow Up-Text       Medication Administration Follow Up Entered On:  04/22/2018 17:59 EST    Performed On:  04/22/2018 18:00 EST by Maisie Fus, RN, Darl Householder      Intervention Information:     ondansetron  Performed by Maisie Fus, RN, BRITNEY O on 04/22/2018 17:55:00 EST       ondansetron,4mg   IV Push,Antecubital, Right,nausea/vomiting       Med Response   ED Medication Response :   Medication administered at discharge, Administered as part of Standard of Care   [place], Harless Nakayama - 04/22/2018 17:59 EST

## 2018-04-22 NOTE — Anesthesia Pre-Procedure Evaluation (Signed)
Preanesthesia GA        Patient:   [name] [name], [name] [name]             MRN: [id number]            FIN: [phone]               Age:   53 years     Sex:  Female     DOB:  [birth year]   Associated Diagnoses:   None   Author:   Wyvonne Lenz      Preoperative Information   Procedure/ Case: lap chole   Surgeon scheduled: LUCAS-MD,  DAVID GRICE   NPO:  NPO greater than 8 hours.    Anesthesia history     Patient's history: negative.     Family's history: negative.        History of Present Illness   54 y.o. woman who has had many anesthetics here in the [facility].  She has a h/o severe PONV, so her last 4-5 anesthetics have been TIVAs.  We are planning to do that today.  Giving Tylenol preop.      Health Status   Allergies:    Allergic Reactions (Selected)  No Known Allergies,       (Active and Proposed Allergies Only)  No Known Allergies   (Severity: Unknown severity, Onset: Unknown)     Current medications:    Home Medications (2) Active  Claritin 10 mg oral tablet 10 mg = 1 tabs, PRN, Oral, qPM  lisinopril 10 mg, Oral, qAM  ,    Medications (12) Active  Scheduled: (2)  acetaminophen 500 mg Tab  1,000 mg 2 tabs, Oral, Once  sodium chloride 0.9% Inj Soln 10 mL syringe  30 mL, IV Push, q8hr  Continuous: (1)  Lactated Ringers Injection solution 1000 mL  1,000 mL, IV, 10 mL/hr  PRN: (9)  A Patient Specific Medication  1 EA, Kit-Combo, q31min  A Patient Specific Refrigerated Medication  1 EA, Kit-Combo, q34min  Delivery and Return Bin Access  1 EA, Kit-Combo, q6min  lidocaine 1% PF Inj Soln 2 mL  0.25 mL, ID, q66min  lidocaine 2% Topical Gel with applicator 10-11 mL  1 app, Topical, q67min  Respiratory MDI Treatment  1 EA, Kit-Combo, q104min  sodium chloride 0.9% Inj Soln 10 mL syringe  30 mL, IV Push, q62min  sodium chloride 0.9% Inj Soln 10 mL vial PF  30 mL, IV Push, q61min  sterile water Inj Soln 10 mL  10 mL, N/A, q67min     Problem list:    Active Problems (9)  Anesthesia   Claustrophobia   Cyst of kidney   Family history of breast  cancer   Family history of cancer of colon   History of basal cell carcinoma   Hypertension   Post-operative nausea and vomiting   Seasonal allergies   ,    Problems   (Active Problems Only)    Absence of sensation   (SNOMED CT: 324401027, Onset: 03/13/99)   Comment:  SEVERE POST OP N&V: REQUESTS TIVA PROTOCOL.  (STOKES, RN, REBECCA H on                      04/17/18)   Postoperative nausea and vomiting   (SNOMED CT: 2536644, Onset: --)  Seasonal allergies...   (SNOMED CT: 7208662814, Onset: --)  Family history of cancer of colon   (SNOMED CT: 160109323, Onset: --)  Cyst of kidney   (SNOMED  CT: [id number], Onset: --)   Comment:  RIGHT--SEEN ON CT--BEING FOLLOWED  Alfonse Ras, RN, Nicholos Johns on 11/01/15)                        History of malignant basal cell neoplasm of skin   (SNOMED CT: 2728858954, Onset: --)  Family history of malignant neoplasm of breast   (SNOMED CT: 1093235573, Onset: --)  Hypertensive disorder   (SNOMED CT: 2202542706, Onset: --)  Claustrophobia   (SNOMED CT: 23762831, Onset: --)   Comment:  STATES NO PROBLEM RIDING ELEVATORS  (Pogorzelski, RN, Orson Ape on                      08/25/16)         Histories   Past Medical History:    Resolved  Hypertension (51761607):  Resolved.  Comments:  11/01/2015 EDT 7:55 EDT - Alfonse Ras, RN, Nicholos Johns  CONTROLLED   Procedure history:    Abdominoplasty (Possible) on 03/01/2017 at 51 Years.  Comments:  03/01/2017 17:28 EST - Celine Ahr, RN, Darl Pikes  auto-populated from documented surgical case  Fat Transfer (site) (Bilateral) on 03/01/2017 at 51 Years.  Comments:  03/01/2017 17:28 EST - Celine Ahr, RN, Darl Pikes  auto-populated from documented surgical case  Breast Revision with Fat Transfer (Bilateral) on 11/27/2016 at 50 Years.  Comments:  11/27/2016 12:20 EDT - Maxwell Caul, RN, KRISTIN Y  auto-populated from documented surgical case  Breast Implant Exchange (Bilateral) on 08/31/2016 at 50 Years.  Comments:  08/31/2016 11:17 EDT - Lorelle Formosa, RN, Samantha  L  auto-populated from documented surgical case  Fat Transfer (site) (Abdomen, Face) on 08/31/2016 at 50 Years.  Comments:  08/31/2016 11:17 EDT - Lorelle Formosa, RN, Samantha L  auto-populated from documented surgical case  Mastectomy Bilateral (Bilateral) on 05/15/2016 at 50 Years.  Comments:  05/15/2016 13:06 EST - Jess Barters, RN, Corinna Capra  auto-populated from documented surgical case  Autologous Injection (Left) on 05/15/2016 at 50 Years.  Comments:  05/15/2016 13:06 EST - Jess Barters, RN, Corinna Capra  auto-populated from documented surgical case  Breast Reconstruction with Implant or Tissue Expander (Bilateral) on 05/15/2016 at 50 Years.  Comments:  05/15/2016 13:06 EST - Jess Barters, RN, Corinna Capra  auto-populated from documented surgical case  Breast Reduction (Bilateral) on 02/15/2016 at 50 Years.  Comments:  02/15/2016 17:03 EST - Louis Matte H  auto-populated from documented surgical case  Colonoscopy with Polyp Cold on 11/01/2015 at 49 Years.  Comments:  11/01/2015 8:59 EDT - PRICE,  MICHELLE A-RN  auto-populated from documented surgical case  Wrist Ganglion Cyst Excision on 01/28/2015 at 49 Years.  Comments:  01/28/2015 13:11 EST - Leatha Gilding, RN, DOROTHY  auto-populated from documented surgical case  Hysterectomy ([id number]).  Comments:  05/11/2016 12:58 EST - STOKES, RN, REBECCA H  TAH  Sling procedure of bladder neck ([id number]).  Arthroscopy of knee (854627035).  Comments:  01/27/2015 15:00 EST - Kennieth Rad  RIGHT  Excision of cyst of breast ([id number]).  Comments:  11/01/2015 7:58 EDT - Alfonse Ras, RN, Nicholos Johns  RIGHT  Cyst of eyelid of left eye ([id number]).  TAH - Total abdominal hysterectomy (993716967).   Social History        Social & Psychosocial Habits    Alcohol  08/25/2016  Use: Current    Type: Beer, Liquor    Frequency: 3-5 times per week    Substance Abuse  08/25/2016  Use: Denies    Tobacco  08/25/2016  Use: Never smoker  .  Physical Examination   Vital Signs   04/22/2018  11:12 EST Systolic Blood Pressure 158 mmHg  HI    Diastolic Blood Pressure 93 mmHg  HI    Temperature Oral 37.2 degC    Heart Rate Monitored 60 bpm    Respiratory Rate 16 br/min    SpO2 99 %      Measurements from flowsheet : Measurements   04/22/2018 11:08 EST Height/Length Measured 161.2 cm    Weight Measured 69.2 kg    Body Mass Index est meas 26.63      Airway:          Mallampati classification: I (soft palate, fauces, uvula, pillars visible).         Thyromental Distance: Normal.         Throat: Within normal limits.    Head:  Normocephalic.    Neck:  Full range of motion.    Respiratory:  Lungs are clear to auscultation, Breath sounds are equal.    Cardiovascular:  Normal rate, Regular rhythm.    Neurologic:  Alert, Oriented.       Review / Management   Results review:     No qualifying data available, Lab results: 04/22/2018 11:09 EST      Estimated Creatinine Clearance            104.58 mL/min  .       Assessment and Plan   American Society of Anesthesiologists#(ASA) physical status classification:  Class II.    Anesthetic Preoperative Plan     Anesthetic technique: General anesthesia.     Maintenance airway: Oral endotracheal tube.     Risks discussed.     Signature Line     Electronically Signed on 04/22/2018 11:47 AM EST   ________________________________________________   Wyvonne Lenz

## 2018-04-22 NOTE — Op Note (Signed)
 Phase II Record - [facility]             Phase II Record - [facility] Summary                                                                  Primary Physician:        Purvis Sheffield    Case Number:              [id number]    Finalized Date/Time:      04/22/18 16:12:54    Pt. Name:                 [name] [name], [name] [name]    D.O.B./Sex:               10-20-1965    Female    Med Rec #:                [id number]    Physician:                Purvis Sheffield    Financial #:              [id number]    Pt. Type:                 R    Room/Bed:                 0534/01    Admit/Disch:              04/22/18 10:48:00 -    Institution:       [facility] Case Attendance - Phase II                                                                                           Entry 1                         Entry 2                                                                          Case Attendee             LUCAS-MD,  DAVID Namon Cirri, RN, Wylene Men    Role Performed            Surgeon Primary                 Phase II Nurse    Time In     Time Out     Last Modified By:  Herring, RN, Tia Masker, RN, Wylene Men                              04/22/18 14:15:45               04/22/18 14:15:45      [facility] Case Times - Phase II                                                                                                Entry 1                                                                                                          Phase II In               04/22/18 14:10:00               Phase II Out                    04/22/18 15:20:00    Phase II Discharge        04/22/18 15:45:00    Time     Last Modified By:         Leitha Bleak RN, Lacey                              04/22/18 16:12:38    General Comments:            1410- Rec'd patient. VSS. Arousable and reports no pain. 1425- Tolerating crackers and gingerale. 1430- Husband           brought [employer] to paitent, told husband that food is not permitted in [facility].  Patient and  patient's husband           verbalizing desire to be admitted for observation due to concern of the possibility of PONV. Husband shooed the           other nurse out of room "to get that process started". 1450- Dr. Samuel Bouche called. Dr. Samuel Bouche ordered for patient to           be admitted for observation. Patient up to bathroom, voided scant amount of urine, no issues. Reports slight           nausea after moving around.  1515- Continues to tolerate gingerale, coke, and crackers. 1545- Patient           transfered to room 534 and report given to [facility].  [facility] Case Times - Phase II Audit                                                                 04/22/18 16:12:38         Owner: [id number]                              Modifier: [id number]                                                           1     <*> Phase II Out            1     <*> Phase II Discharge Time                Finalized By: Lissa Hoard      Document Signatures                                                                             Signed By:           Leitha Bleak RN, Lacey 04/22/18 16:12

## 2018-04-22 NOTE — Discharge Summary (Signed)
 Inpatient Clinical Summary             [facility]  Post-Acute Care Transfer Instructions  PERSON INFORMATION   Name: [name] [name], [name] [name]  MRN: [id number]    FIN#: [id number]   PHYSICIANS  Admitting Physician: Purvis Sheffield  Attending Physician: Purvis Sheffield   PCP: Gillian Scarce  Discharge Diagnosis:    Comment:       PATIENT EDUCATION INFORMATION  Instructions:             Anesthesia: After Your Surgery (JEFFCO) (Custom); Laparoscopic Cholecystectomy, Discharge Instructions  Medication Leaflets:               Follow-up:                           With: Address: When:   DAVID LUCAS-MD [address], [address] [place] [address]  [phone] Business (1)    Comments:   FOLLOW UP AS INSTRUCTED BY MD. CALL OFFICE FOR FOLLOW UP APPOINTMENT.       With: Address: When:   RHONDA CHANSON-MD [address], [address] [place], [place] [address]  [phone] Business (1)                              MEDICATION LIST  Medication Reconciliation at Discharge:          New Medications  Printed Prescriptions  ondansetron (Zofran ODT 4 mg oral tablet, disintegrating) 1 Tabs Oral (given by mouth) once as needed nausea. Refills: 1.  Last Dose:____________________  oxyCODONE (oxyCODONE 5 mg oral tablet) 1 Tabs Oral (given by mouth) every 6 hours as needed for pain. Refills: 0.  Last Dose:____________________  Medications that have not changed  Other Medications  lisinopril 10 Milligram Oral (given by mouth) once a day (in the morning).  Last Dose:____________________  loratadine (Claritin 10 mg oral tablet) 1 Tabs Oral (given by mouth) once a day (in the evening) as needed as needed for allergy symptoms.  Last Dose:____________________         Patient???s Final Home Medication List Upon Discharge:           lisinopril 10 Milligram Oral (given by mouth) once a day (in the morning).  loratadine (Claritin 10 mg oral tablet) 1 Tabs Oral (given by mouth) once a day (in the evening) as needed as  needed for allergy symptoms.  ondansetron (Zofran ODT 4 mg oral tablet, disintegrating) 1 Tabs Oral (given by mouth) once as needed nausea. Refills: 1.  oxyCODONE (oxyCODONE 5 mg oral tablet) 1 Tabs Oral (given by mouth) every 6 hours as needed for pain. Refills: 0.         Comment:       ORDERS          Order Name Order Details   Diet Instruction Regular home diet   Discharge Patient 04/22/18 13:40:00 EST, Discharge Home/Self Care   Discharge Special Instructions May shower, remove dressings in a day or two.   Discharge Special Instructions Follow up 1-2 weeks Samuel Bouche Call for appointment [phone]   Discharge Special Instructions Diet and activity as tolerated, no lifting, ok to shower.

## 2018-04-22 NOTE — Discharge Summary (Signed)
 Inpatient Patient Summary               [facility]  [address]  [place], [place] [address]  [phone]  Patient Discharge Instructions     Name: [name] [name], [name] [name]  Current Date: 04/22/2018 14:14:32  DOB: [birth year] MRN: [id number] FIN: NBR%>[phone]  Patient Address: [address] [place] [address]  Patient Phone: [phone]  Primary Care Provider:  Name: Gillian Scarce  Phone: [phone]   Immunizations Provided:       Discharge Diagnosis:   Discharged To: TO, ANTICIPATED%>  Home Treatments: TREATMENTS, ANTICIPATED%>  Devices/Equipment: EQUIPMENT REHAB%>  Post Hospital Services: HOSPITAL SERVICES%>  Professional Skilled Services: SKILLED SERVICES%>  Therapist, sports and Community Resources:               SERV AND COMM RES, ANTICIPATED%>  Mode of Discharge Transportation: TRANSPORTATION%>  Discharge Orders          Diet Instruction Regular home diet  Discharge Patient 04/22/18 13:40:00 EST, Discharge Home/Self Care  Discharge Special Instructions Diet and activity as tolerated, no lifting, ok to shower.  Discharge Special Instructions May shower, remove dressings in a day or two.  Discharge Special Instructions Follow up 1-2 weeks Samuel Bouche   Call for appointment [phone]         Comment:      Medications   During the course of your visit, your medication list was updated with the most current information. The details of those changes are reflected below:          New Medications  Printed Prescriptions  ondansetron (Zofran ODT 4 mg oral tablet, disintegrating) 1 Tabs Oral (given by mouth) once as needed nausea. Refills: 1.  Last Dose:____________________  oxyCODONE (oxyCODONE 5 mg oral tablet) 1 Tabs Oral (given by mouth) every 6 hours as needed for pain. Refills: 0.  Last Dose:____________________  Medications that have not changed  Other Medications  lisinopril 10 Milligram Oral (given by mouth) once a day (in the morning).  Last Dose:____________________  loratadine (Claritin  10 mg oral tablet) 1 Tabs Oral (given by mouth) once a day (in the evening) as needed as needed for allergy symptoms.  Last Dose:____________________         [facility] would like to thank you for allowing [place] to assist you with your healthcare needs. The following includes patient education materials and information regarding your injury/illness.     [name], [name] has been given the following list of follow-up instructions, prescriptions, and patient education materials:  Follow-up Instructions:              With: Address: When:   DAVID LUCAS-MD [address], [address] [place] [address]  [phone] Business (1)    Comments:   FOLLOW UP AS INSTRUCTED BY MD. CALL OFFICE FOR FOLLOW UP APPOINTMENT.       With: Address: When:   RHONDA CHANSON-MD [address], [address] [place], [place] [address]  [phone] Business (1)                     It is important to always keep an active list of medications available so that you can share with other providers and manage your medications appropriately. As an additional courtesy, we are also providing you with your final active medications list that you can keep with you.            lisinopril 10 Milligram Oral (given  by mouth) once a day (in the morning).  loratadine (Claritin 10 mg oral tablet) 1 Tabs Oral (given by mouth) once a day (in the evening) as needed as needed for allergy symptoms.  ondansetron (Zofran ODT 4 mg oral tablet, disintegrating) 1 Tabs Oral (given by mouth) once as needed nausea. Refills: 1.  oxyCODONE (oxyCODONE 5 mg oral tablet) 1 Tabs Oral (given by mouth) every 6 hours as needed for pain. Refills: 0.      Take only the medications listed above. Contact your doctor prior to taking any medications not on this list.        Discharge instructions, if any, will display below     Instructions for Diet: INSTRUCTIONS FOR DIET%>   Instructions for Supplements: SUPPLEMENT INSTRUCTIONS%>   Instructions for Activity: INSTRUCTIONS FOR  ACTIVITY%>   Instructions for Wound Care: INSTRUCTIONS FOR WOUND CARE%>     Medication leaflets, if any, will display below     Patient education materials, if any, will display below               Discharge Instructions: After Your Surgery  You???ve just had surgery. During surgery you were given medicine called anesthesia to keep you relaxed and free of pain. After surgery you may have some pain or nausea. This is common. Here are some tips for feeling better and getting well after surgery.     Stay on schedule with your medication.   Going home  Your doctor or nurse will show you how to take care of yourself when you go home. He or she will also answer your questions. Have an adult family member or friend drive you home. For the first 24 hours after your surgery:  ?? Do not drive or use heavy equipment.  ?? Do not make important decisions or sign legal papers.  ?? Do not drink alcohol.  ?? Have someone stay with you, if needed. He or she can watch for problems and help keep you safe.  ?? In case of emergency call 911  Be sure to go to all follow-up visits with your doctor. And rest after your surgery for as long as your doctor tells you to.  Coping with pain  If you have pain after surgery, pain medicine will help you feel better. Take it as told, before pain becomes severe. Also, ask your doctor or pharmacist about other ways to control pain. This might be with heat, ice, or relaxation. And follow any other instructions your surgeon or nurse gives you.  Tips for taking pain medicine  To get the best relief possible, remember these points:  ?? Pain medicines can upset your stomach. Taking them with a little food may help.  ?? Most pain relievers taken by mouth need at least 20 to 30 minutes to start to work.  ?? Taking medicine on a schedule can help you remember to take it. Try to time your medicine so that you can take it before starting an activity. This might be before you get dressed, go for a walk, or sit down for  dinner.  ?? Constipation is a common side effect of pain medicines. Call your doctor before taking any medicines such as laxatives or stool softeners to help ease constipation. Also ask if you should skip any foods. Drinking lots of fluids and eating foods such as fruits and vegetables that are high in fiber can also help. Remember, do not take laxatives unless your surgeon has prescribed them.  ?? Drinking  alcohol and taking pain medicine can cause dizziness and slow your breathing. It can even be deadly. Do not drink alcohol while taking pain medicine.  ?? Pain medicine can make you react more slowly to things. Do not drive or run machinery while taking pain medicine.  Managing nausea  Some people have an upset stomach after surgery. This is often because of anesthesia, pain, or pain medicine, or the stress of surgery. These tips will help you handle nausea and eat healthy foods as you get better. If you were on a special food plan before surgery, ask your doctor if you should follow it while you get better. These tips may help:  ?? Do not push yourself to eat. Your body will tell you when to eat and how much.  ?? Start off with clear liquids and soup. They are easier to digest.  ?? Next try semi-solid foods, such as mashed potatoes, applesauce, and gelatin, as you feel ready.  ?? Slowly move to solid foods. Don???t eat fatty, rich, or spicy foods at first.  ?? Do not force yourself to have 3 large meals a day. Instead eat smaller amounts more often.  ?? Take pain medicines with a small amount of solid food, such as crackers or toast, to avoid nausea.     Call your surgeon if???  ?? You still have intollerable pain an hour after taking medicine. The medicine may not be strong enough.  ?? You feel too sleepy, dizzy, or groggy. The medicine may be too strong.  ?? You have side effects like nausea, vomiting, or skin changes, such as rash, itching, or hives.      If you have obstructive sleep apnea  You were given anesthesia medicine  during surgery to keep you comfortable and free of pain. After surgery, you may have more apnea spells because of this medicine and other medicines you were given. The spells may last longer than usual.   At home:  ?? Keep using the continuous positive airway pressure (CPAP) device when you sleep. Unless your health care provider tells you not to, use it when you sleep, day or night. CPAP is a common device used to treat obstructive sleep apnea.  ?? Talk with your provider before taking any pain medicine, muscle relaxants, or sedatives. Your provider will tell you about the possible dangers of taking these medicines.    ?? 2000-2015 [employer] [address] [place], [place] [address]. All rights reserved. This information is not intended as a substitute for professional medical care. Always follow your healthcare professional's instructions.          Discharge Instructions for Laparoscopic Cholecystectomy   You have had a procedure known as a laparoscopic cholecystectomy. A laparoscopic cholecystectomy is a procedure to remove your gallbladder. People who have this procedure usually recover more quickly and have less pain than with open gallbladder surgery (called open cholecystectomy). Many surgeons recommend a low-fat diet, avoiding fried food in particular, for the first month after surgery.    You can live a full and healthy life without your gallbladder. This includes eating the foods and doing the things you enjoyed before your gallbladder problems started.   Home care   Recommendations for home care include the following:    ?? Ask someone to drive you to your appointments for the next 3 days. Dont drive until you are no longer taking pain medicine and are able to step on the brake pedal without hesitation.    ??  Wash the skin around your incision daily with mild soap and water. It's OK to shower the day after your surgery.   ?? Eat your regular diet. It is wise to stay away from rich, greasy, or  spicy food for a few days.   ?? Remember, it takes at least 1 week for you to get most of your strength and energy back.   ?? Make an office visit to talk to your healthcare provider if the following symptoms dont go away within a week after your surgery:   ?? Fatigue   ?? Pain around the incision   ?? Diarrhea or constipation   ?? Loss of appetite       When to call your healthcare provider   Call your healthcare provider immediately if you have any of the following:   ?? Yellowing of your eyes or skin (jaundice)   ?? Chills   ?? Fever of 100.4??F (38.0??C) or higher, or as directed by your healthcare provider    ?? Redness, swelling, increasing pain, pus, or a foul smell at the incision site   ?? Dark or rust-colored urine   ?? Stool that is clay-colored or light in color instead of brown   ?? Increasing belly pain   ?? Rectal bleeding   ?? Leg swelling or shortness of breath     ?? 2000-2017 [employer] [address], [place], [place] [address]. All rights reserved. This information is not intended as a substitute for professional medical care. Always follow your healthcare professional's instructions.            IS IT A STROKE? Act FAST and Check for these signs:    FACE                         Does the face look uneven?    ARM                         Does one arm drift down?    SPEECH                    Does their speech sound strange?    TIME                         Call 9-1-1 at any sign of stroke  ---------------------------------------------------------------------------------------------------------------------------  Heart Attack Signs  Chest discomfort: Most heart attacks involve discomfort in the center of the chest and lasts more than a few minutes, or goes away and comes back. It can feel like uncomfortable pressure, squeezing, fullness or pain.  Discomfort in upper body: Symptoms can include pain or discomfort in one or both arms, back, neck, jaw or stomach.  Shortness of breath: With or without  discomfort.  Other signs: Breaking out in a cold sweat, nausea, or lightheaded.  Remember, MINUTES DO MATTER. If you experience any of these heart attack warning signs, call 9-1-1 to get immediate medical attention!     ---------------------------------------------------------------------------------------------------------------------------  [facility] allows you to manage your health, view your test results, and retrieve your discharge documents from your hospital stay securely and conveniently from your computer.  To begin the enrollment process, visit [email or web address] Click on ???Sign up now??? under [facility].

## 2018-04-22 NOTE — Assessment & Plan Note (Signed)
PreOp Record - [facility]             PreOp Record - [facility] Summary                                                                     Primary Physician:        Purvis Sheffield    Case Number:              [id number]    Finalized Date/Time:      04/22/18 11:30:08    Pt. Name:                 [name] [name], [name] [name]    D.O.B./Sex:               [birth year]    Female    Med Rec #:                [id number]    Physician:                Purvis Sheffield    Financial #:              [id number]    Pt. Type:                 S    Room/Bed:                 /    Admit/Disch:              04/22/18 10:48:00 -    Institution:       [facility] Case Attendance - PreOp                                                                                              Entry 1                         Entry 2                                                                          Case Attendee             LUCAS-MD,  DAVID Eliseo Squires, RN, SHELLEY P    Role Performed            Surgeon Primary                 Preoperative Nurse    Time In     Time Out     Last Modified By:  Adrian Blackwater, RN, Ave Filter, RN, [place] P                              04/22/18 10:57:00               04/22/18 10:57:17      [facility] Case Attendance - PreOp Audit                                                               04/22/18 10:57:17         Owner: PETISH                               Modifier: PETISH                                                        <+> 2         Case Attendee        <+> 2         Role Performed        [facility] Case Times - PreOp                                                                                                   Entry 1                                                                                                          Patient In Room Time      04/22/18 11:08:00               Nurse In Time                   04/22/18 11:08:00    Nurse Out Time            04/22/18 11:30:00               Patient Ready for                04/22/18 11:30:00  Surgery/Procedure     Last Modified By:         Adrian Blackwater RN, Vance Gather P                              04/22/18 11:30:07      [facility] Case Times - PreOp Audit                                                                    04/22/18 11:30:07         Owner: PETISH                               Modifier: PETISH                                                        <+> 1         Patient Ready for Surgery/Procedure        <+> 1         Nurse Out Time                Finalized By: Adrian Blackwater RN, SHELLEY P      Document Signatures                                                                             Signed By:           Garry Heater, SHELLEY P 04/22/18 11:30

## 2018-11-25 LAB — TSH: TSH, 3RD GENERATION: 3.48 mcIU/mL (ref 0.358–3.740)

## 2018-11-25 LAB — FOLLICLE STIMULATING HORMONE: FSH: 10.45 IU/mL

## 2018-11-25 LAB — TESTOSTERONE: Testosterone: 3.1 ng/dL (ref 2.9–40.8)

## 2018-11-25 LAB — ESTRADIOL: Estradiol: 80.53 pg/mL

## 2018-11-25 LAB — PROGESTERONE: Progesterone: 0.16 ng/mL

## 2018-12-14 LAB — COVID-19: SARS-CoV-2: NOT DETECTED

## 2019-01-23 LAB — COVID-19, FLU A/B, AND RSV COMBO
RSV Antigen: NOT DETECTED
Rapid Influenza A Ag: NOT DETECTED
Rapid Influenza B Ag: NOT DETECTED
SARS-CoV-2, Rapid: NOT DETECTED

## 2019-03-31 LAB — PROGESTERONE: Progesterone: 2.2 ng/mL

## 2019-03-31 LAB — ESTRADIOL: Estradiol: 98.64 pg/mL

## 2019-03-31 LAB — TSH: TSH, 3RD GENERATION: 2.53 mcIU/mL (ref 0.358–3.740)

## 2019-03-31 LAB — FOLLICLE STIMULATING HORMONE: FSH: 22.86 IU/mL

## 2019-04-04 LAB — TESTOSTERONE, TOTAL, LC/MS/MS: Testosterone, LCMS: 17.8 ng/dL

## 2019-08-15 LAB — ESTRADIOL: Estradiol: 51.13 pg/mL

## 2019-08-15 LAB — PROGESTERONE: Progesterone: 5.02 ng/mL

## 2019-08-18 LAB — TESTOSTERONE, TOTAL, LC/MS/MS: Testosterone, LCMS: 73 ng/dL

## 2019-10-27 DIAGNOSIS — B349 Viral infection, unspecified: Secondary | ICD-10-CM | POA: Diagnosis not present

## 2019-10-27 DIAGNOSIS — Z20822 Contact with and (suspected) exposure to covid-19: Secondary | ICD-10-CM | POA: Diagnosis not present

## 2019-10-29 ENCOUNTER — Other Ambulatory Visit: Payer: Self-pay

## 2019-12-03 ENCOUNTER — Encounter: Payer: Self-pay | Admitting: Nurse Practitioner

## 2019-12-03 ENCOUNTER — Ambulatory Visit (INDEPENDENT_AMBULATORY_CARE_PROVIDER_SITE_OTHER): Payer: BC Managed Care – PPO | Admitting: Nurse Practitioner

## 2019-12-03 ENCOUNTER — Other Ambulatory Visit: Payer: Self-pay

## 2019-12-03 VITALS — BP 144/86 | HR 62 | Temp 98.6°F | Ht 64.4 in | Wt 156.4 lb

## 2019-12-03 DIAGNOSIS — I10 Essential (primary) hypertension: Secondary | ICD-10-CM

## 2019-12-03 DIAGNOSIS — Z86010 Personal history of colonic polyps: Secondary | ICD-10-CM | POA: Diagnosis not present

## 2019-12-03 DIAGNOSIS — Z7689 Persons encountering health services in other specified circumstances: Secondary | ICD-10-CM

## 2019-12-03 DIAGNOSIS — Z9109 Other allergy status, other than to drugs and biological substances: Secondary | ICD-10-CM

## 2019-12-03 DIAGNOSIS — Z13228 Encounter for screening for other metabolic disorders: Secondary | ICD-10-CM | POA: Diagnosis not present

## 2019-12-03 DIAGNOSIS — Z23 Encounter for immunization: Secondary | ICD-10-CM | POA: Diagnosis not present

## 2019-12-03 DIAGNOSIS — Z1211 Encounter for screening for malignant neoplasm of colon: Secondary | ICD-10-CM

## 2019-12-03 DIAGNOSIS — Z01419 Encounter for gynecological examination (general) (routine) without abnormal findings: Secondary | ICD-10-CM

## 2019-12-03 NOTE — Progress Notes (Signed)
I,Yamilka Roman [employer] as a Education administrator for [employer], FNP.,have documented all relevant documentation on the behalf of Minette Brine, FNP,as directed by  Minette Brine, FNP while in the presence of Minette Brine, [place]. This visit occurred during the SARS-CoV-2 public health emergency.  Safety protocols were in place, including screening questions prior to the visit, additional usage of staff PPE, and extensive cleaning of exam room while observing appropriate contact time as indicated for disinfecting solutions.  Subjective:     Patient ID: Terri Smith , female    DOB: 01/30/1966 , 54 y.o.   MRN: [id number]   Chief Complaint  Patient presents with  . Establish Care    pati    HPI  Here to establish care - [place]. She has recently relocated since June 2021.  She found [place] online when looking for internal medicine providers.  She did see Dr. Allena Napoleon early August - with respiratory issues.  Married.  She has 3 boys - 2 boys in [place] ([place]) and [place]), Loa Socks is in college getting his Master's degree.  She is getting ready to start working a [employer] - works as a Therapist, sports in [place].  Non-smoker, alcohol - 2 drinks a week at most. She had been treated with albuterol inhaler, she is concerned about having asthma and was recommended to have PFTs, she has to use an inhaler at times when it gets cold and recently had a steroid dose pack  Family history of breast cancer - mother, aunt and paternal  CHEK 2 - prophylactic mastectomy with reconstruction 2018, done at [facility] in [place].  She did have a GYN and had been going to Oncology - at this time will continue with the provider in [place] until she finds one locally.  Dr. Tyler Aas or Clotilde Dieter NP ([facility]).   Hypertension - lisinopril since 2015, she has also had HCTZ.    When she had her gallbladder removed there was an incidental finding of right renal cyst.  She  has seen dr. Lynne Leader (Urology) [facility] she will continue to see him for now.    She has had a colonoscopy in the past about 3 years ago. Had polyps and her father had colon cancer.  Done with [facility].    Zoila Shutter (hormone therapy) - she will continue to see her as well.       Past Medical History:  Diagnosis Date  . Encounter for prophylactic removal of breast, bilateral   . Hormonal disorder   . Hypertension   . Renal cyst, right      Family History  Problem Relation Age of Onset  . Cancer Mother   . Asthma Mother   . Diabetes Father   . Pulmonary Hypertension Father   . COPD Father   . Colon cancer Father   . Breast cancer Maternal Aunt   . Breast cancer Maternal Grandfather   . Healthy Paternal Grandmother      Current Outpatient Medications:  .  budesonide (RHINOCORT ALLERGY) 32 MCG/ACT nasal spray, Place 1 spray into both nostrils daily., Disp: , Rfl:  .  fexofenadine (ALLEGRA) 60 MG tablet, Take 60 mg by mouth daily., Disp: , Rfl:  .  lisinopril (ZESTRIL) 10 MG tablet, Take 10 mg by mouth daily., Disp: , Rfl:  .  progesterone (PROMETRIUM) 200 MG capsule, Take 300 mg by mouth daily. Take 1 tablet daily, Disp: , Rfl:  .  UNABLE TO FIND, Testosterone 34m propylene glycol  Take 3 drops once daily, Disp: , Rfl:    No Known Allergies   Review of Systems  Constitutional: Negative.   Respiratory: Negative.   Cardiovascular: Negative.  Negative for chest pain, palpitations and leg swelling.  Psychiatric/Behavioral: Negative.      Today's Vitals   12/03/19 1555  BP: (!) 144/86  Pulse: 62  Temp: 98.6 F (37 C)  TempSrc: Oral  Weight: 156 lb 6.4 oz (70.9 kg)  Height: 5' 4.4" (1.636 m)  PainSc: 0-No pain   Body mass index is 26.51 kg/m.   Objective:  Physical Exam Constitutional:      General: She is not in acute distress.    Appearance: Normal appearance.  Cardiovascular:     Rate and Rhythm: Normal rate and regular rhythm.     Pulses:  Normal pulses.     Heart sounds: Normal heart sounds. No murmur heard.   Pulmonary:     Effort: Pulmonary effort is normal. No respiratory distress.     Breath sounds: Normal breath sounds.  Neurological:     General: No focal deficit present.     Mental Status: She is alert and oriented to person, place, and time.     Cranial Nerves: No cranial nerve deficit.  Psychiatric:        Mood and Affect: Mood normal.        Behavior: Behavior normal.        Thought Content: Thought content normal.        Judgment: Judgment normal.         Assessment And Plan:     1. Establishing care with new doctor, encounter for  This is [place] visit to [place] 2. Need for influenza vaccination  Influenza vaccine administered  Encouraged to take Tylenol as needed for fever or muscle aches. - Flu Vaccine QUAD 6+ mos PF IM (Fluarix Quad PF)  3. Encounter for screening colonoscopy  According to USPTF Colorectal cancer Screening guidelines. Colonoscopy is recommended every 10 years, starting at age 4474years  Will refer to GI for colon cancer screening. - Ambulatory referral to Gastroenterology  4. History of colon polyps  Will refer to GI, she has a previous history of colon polyps - Ambulatory referral to Gastroenterology  5. Environmental allergies She would like an appt with an allergy specialist due to her history - Ambulatory referral to Allergy  6. Encounter for screening for metabolic disorder - Hemoglobin A1c  7. Essential hypertension  Blood pressure is elevated today but this may be due to this is her first visit to the office - CMP14+EGFR - CBC  8. Encounter for gynecological examination  She would like a referral to GYN  - Ambulatory referral to Obstetrics / Gynecology     Patient was given opportunity to ask questions. Patient verbalized understanding of the plan and was able to repeat key elements of the plan. All questions were answered  to their satisfaction.    ITeola Bradley FNP, have reviewed all documentation for this visit. The documentation on 12/24/19 for the exam, diagnosis, procedures, and orders are all accurate and complete.  THE PATIENT IS ENCOURAGED TO PRACTICE SOCIAL DISTANCING DUE TO THE COVID-19 PANDEMIC.

## 2019-12-04 LAB — CBC
Hematocrit: 40.8 % (ref 34.0–46.6)
Hemoglobin: 14.1 g/dL (ref 11.1–15.9)
MCH: 29.7 pg (ref 26.6–33.0)
MCHC: 34.6 g/dL (ref 31.5–35.7)
MCV: 86 fL (ref 79–97)
Platelets: 228 10*3/uL (ref 150–450)
RBC: 4.75 x10E6/uL (ref 3.77–5.28)
RDW: 12.8 % (ref 11.7–15.4)
WBC: 6.2 10*3/uL (ref 3.4–10.8)

## 2019-12-04 LAB — CMP14+EGFR
ALT: 15 IU/L (ref 0–32)
AST: 15 IU/L (ref 0–40)
Albumin/Globulin Ratio: 1.7 (ref 1.2–2.2)
Albumin: 4.6 g/dL (ref 3.8–4.9)
Alkaline Phosphatase: 62 IU/L (ref 44–121)
BUN/Creatinine Ratio: 15 (ref 9–23)
BUN: 12 mg/dL (ref 6–24)
Bilirubin Total: 0.2 mg/dL (ref 0.0–1.2)
CO2: 27 mmol/L (ref 20–29)
Calcium: 9.7 mg/dL (ref 8.7–10.2)
Chloride: 103 mmol/L (ref 96–106)
Creatinine, Ser: 0.78 mg/dL (ref 0.57–1.00)
GFR calc Af Amer: 100 mL/min/{1.73_m2} (ref >59)
GFR calc non Af Amer: 87 mL/min/{1.73_m2} (ref >59)
Globulin, Total: 2.7 g/dL (ref 1.5–4.5)
Glucose: 83 mg/dL (ref 65–99)
Potassium: 4.2 mmol/L (ref 3.5–5.2)
Sodium: 142 mmol/L (ref 134–144)
Total Protein: 7.3 g/dL (ref 6.0–8.5)

## 2019-12-04 LAB — HEMOGLOBIN A1C
Est. average glucose Bld gHb Est-mCnc: 105 mg/dL
Hgb A1c MFr Bld: 5.3 % (ref 4.8–5.6)

## 2019-12-10 DIAGNOSIS — Z20822 Contact with and (suspected) exposure to covid-19: Secondary | ICD-10-CM | POA: Diagnosis not present

## 2019-12-10 DIAGNOSIS — Z0289 Encounter for other administrative examinations: Secondary | ICD-10-CM | POA: Diagnosis not present

## 2020-01-15 ENCOUNTER — Ambulatory Visit: Payer: BC Managed Care – PPO | Admitting: Nurse Practitioner

## 2020-02-11 DIAGNOSIS — Z6826 Body mass index (BMI) 26.0-26.9, adult: Secondary | ICD-10-CM | POA: Diagnosis not present

## 2020-02-11 DIAGNOSIS — Z01419 Encounter for gynecological examination (general) (routine) without abnormal findings: Secondary | ICD-10-CM | POA: Diagnosis not present

## 2020-02-12 DIAGNOSIS — Z9189 Other specified personal risk factors, not elsewhere classified: Secondary | ICD-10-CM | POA: Insufficient documentation

## 2020-02-25 DIAGNOSIS — Z13220 Encounter for screening for lipoid disorders: Secondary | ICD-10-CM | POA: Diagnosis not present

## 2020-02-25 LAB — LIPID PANEL
HDL: 56 (ref 35–70)
LDL Cholesterol: 168
LDl/HDL Ratio: 3
Triglycerides: 111 (ref 40–160)

## 2020-03-10 ENCOUNTER — Other Ambulatory Visit: Payer: Self-pay

## 2020-03-10 MED ORDER — LISINOPRIL 10 MG PO TABS: 10.0000 mg | ORAL_TABLET | Freq: Every day | ORAL | 1 refills | Status: AC

## 2020-03-23 ENCOUNTER — Ambulatory Visit (INDEPENDENT_AMBULATORY_CARE_PROVIDER_SITE_OTHER): Payer: BC Managed Care – PPO | Admitting: Nurse Practitioner

## 2020-03-23 ENCOUNTER — Other Ambulatory Visit: Payer: Self-pay

## 2020-03-23 ENCOUNTER — Encounter: Payer: Self-pay | Admitting: Nurse Practitioner

## 2020-03-23 VITALS — BP 116/80 | HR 57 | Temp 98.1°F | Ht 64.4 in | Wt 160.0 lb

## 2020-03-23 DIAGNOSIS — E7800 Pure hypercholesterolemia, unspecified: Secondary | ICD-10-CM

## 2020-03-23 DIAGNOSIS — I10 Essential (primary) hypertension: Secondary | ICD-10-CM | POA: Diagnosis not present

## 2020-03-23 NOTE — Progress Notes (Signed)
I,Yamilka Roman [employer] as a Neurosurgeon for [employer], FNP.,have documented all relevant documentation on the behalf of Arnette Felts, FNP,as directed by  Arnette Felts, FNP while in the presence of Arnette Felts, FNP. This visit occurred during the SARS-CoV-2 public health emergency.  Safety protocols were in place, including screening questions prior to the visit, additional usage of staff PPE, and extensive cleaning of exam room while observing appropriate contact time as indicated for disinfecting solutions.  Subjective:     Patient ID: Terri Smith , female    DOB: 12-Jan-1966 , 55 y.o.   MRN: [id number]   Chief Complaint  Patient presents with  . Hypertension    HPI  Patient presents today for a f/u on her blood pressure.  She had her lipids done om 12/16 total cholesterol was 266 and LDL 168.  This was fasting.  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 She is running 3 times a week.  She is trying to avoid eating as much fried foods.    She did not go to the allergist with the referral that was sent.   Hypertension This is a chronic problem. The current episode started more than 1 year ago. The problem is controlled. Pertinent negatives include no anxiety, chest pain, headaches or palpitations. Risk factors for coronary artery disease include family history. There are no compliance problems.  There is no history of angina. There is no history of chronic renal disease.     Past Medical History:  Diagnosis Date  . Encounter for prophylactic removal of breast, bilateral   . Hormonal disorder   . Hypertension   . Renal cyst, right      Family History  Problem Relation Age of Onset  . Cancer Mother   . Asthma Mother   . Diabetes Father   . Pulmonary Hypertension Father   . COPD Father   . Colon cancer Father   . Breast cancer Maternal Aunt   . Breast cancer Maternal Grandfather   . Healthy Paternal Grandmother      Current Outpatient Medications:  .  budesonide  (RHINOCORT AQUA) 32 MCG/ACT nasal spray, Place 1 spray into both nostrils daily., Disp: , Rfl:  .  fexofenadine (ALLEGRA) 60 MG tablet, Take 60 mg by mouth daily., Disp: , Rfl:  .  lisinopril (ZESTRIL) 10 MG tablet, Take 1 tablet (10 mg total) by mouth daily., Disp: 90 tablet, Rfl: 1 .  progesterone (PROMETRIUM) 200 MG capsule, Take 300 mg by mouth daily. Take 1 tablet daily, Disp: , Rfl:  .  UNABLE TO FIND, Testosterone 20mg  propylene glycol  Take 3 drops once daily, Disp: , Rfl:    No Known Allergies   Review of Systems  Respiratory: Negative.   Cardiovascular: Negative.  Negative for chest pain, palpitations and leg swelling.  Neurological: Negative for dizziness and headaches.  Psychiatric/Behavioral: Negative.      Today's Vitals   03/23/20 1417  BP: 116/80  Pulse: (!) 57  Temp: 98.1 F (36.7 C)  TempSrc: Oral  Weight: 160 lb (72.6 kg)  Height: 5' 4.4" (1.636 m)  PainSc: 0-No pain   Body mass index is 27.12 kg/m.   Objective:  Physical Exam Vitals reviewed.  Constitutional:      General: She is not in acute distress.    Appearance: Normal appearance.  Cardiovascular:     Rate and Rhythm: Normal rate and regular rhythm.     Pulses: Normal pulses.     Heart sounds: Normal  heart sounds. No murmur heard.   Pulmonary:     Effort: Pulmonary effort is normal. No respiratory distress.     Breath sounds: Normal breath sounds. No wheezing.  Neurological:     General: No focal deficit present.     Mental Status: She is alert and oriented to person, place, and time.     Cranial Nerves: No cranial nerve deficit.  Psychiatric:        Mood and Affect: Mood normal.        Behavior: Behavior normal.        Thought Content: Thought content normal.        Judgment: Judgment normal.         Assessment And Plan:     1. Essential hypertension  Chronic, her blood pressure is well controlled   2. Elevated LDL cholesterol level  Slightly elevated levels abstracted from  her GYN, discussed importance of low fat diet and increasing fiber intake.     Patient was given opportunity to ask questions. Patient verbalized understanding of the plan and was able to repeat key elements of the plan. All questions were answered to their satisfaction.  Arnette Felts, FNP   I, Arnette Felts, FNP, have reviewed all documentation for this visit. The documentation on 04/11/20 for the exam, diagnosis, procedures, and orders are all accurate and complete.   THE PATIENT IS ENCOURAGED TO PRACTICE SOCIAL DISTANCING DUE TO THE COVID-19 PANDEMIC.

## 2020-03-23 NOTE — Patient Instructions (Signed)

## 2020-03-24 ENCOUNTER — Ambulatory Visit: Payer: Self-pay | Admitting: Nurse Practitioner

## 2020-04-05 ENCOUNTER — Encounter: Payer: Self-pay | Admitting: Nurse Practitioner

## 2020-04-11 ENCOUNTER — Encounter: Payer: Self-pay | Admitting: Nurse Practitioner

## 2020-06-11 DIAGNOSIS — Z86010 Personal history of colonic polyps: Secondary | ICD-10-CM | POA: Diagnosis not present

## 2020-06-11 DIAGNOSIS — Z800 Family history of malignant neoplasm of digestive organs: Secondary | ICD-10-CM | POA: Diagnosis not present

## 2020-06-11 DIAGNOSIS — Z1589 Genetic susceptibility to other disease: Secondary | ICD-10-CM | POA: Diagnosis not present

## 2020-06-16 ENCOUNTER — Other Ambulatory Visit: Payer: BC Managed Care – PPO

## 2020-06-16 ENCOUNTER — Other Ambulatory Visit: Payer: Self-pay

## 2020-06-16 DIAGNOSIS — E7800 Pure hypercholesterolemia, unspecified: Secondary | ICD-10-CM | POA: Diagnosis not present

## 2020-06-16 LAB — LIPID PANEL
Chol/HDL Ratio: 4.4 ratio (ref 0.0–4.4)
Cholesterol, Total: 230 mg/dL — ABNORMAL HIGH (ref 100–199)
HDL: 52 mg/dL (ref >39)
LDL Chol Calc (NIH): 159 mg/dL — ABNORMAL HIGH (ref 0–99)
Triglycerides: 106 mg/dL (ref 0–149)
VLDL Cholesterol Cal: 19 mg/dL (ref 5–40)

## 2020-06-24 ENCOUNTER — Ambulatory Visit: Payer: BC Managed Care – PPO | Admitting: Nurse Practitioner

## 2020-07-02 ENCOUNTER — Encounter: Payer: Self-pay | Admitting: Nurse Practitioner

## 2020-07-06 ENCOUNTER — Ambulatory Visit: Payer: BC Managed Care – PPO | Admitting: Nurse Practitioner

## 2020-07-08 ENCOUNTER — Encounter: Payer: Self-pay | Admitting: Family Medicine

## 2020-07-22 ENCOUNTER — Ambulatory Visit: Payer: BC Managed Care – PPO | Attending: Internal Medicine

## 2020-07-22 ENCOUNTER — Other Ambulatory Visit: Payer: Self-pay

## 2020-07-22 DIAGNOSIS — Z20822 Contact with and (suspected) exposure to covid-19: Secondary | ICD-10-CM | POA: Insufficient documentation

## 2020-07-24 LAB — NOVEL CORONAVIRUS, NAA: SARS-CoV-2, NAA: NOT DETECTED

## 2020-07-24 LAB — SARS-COV-2, NAA 2 DAY TAT

## 2020-08-19 DIAGNOSIS — Z800 Family history of malignant neoplasm of digestive organs: Secondary | ICD-10-CM | POA: Diagnosis not present

## 2020-08-19 DIAGNOSIS — D123 Benign neoplasm of transverse colon: Secondary | ICD-10-CM | POA: Diagnosis not present

## 2020-08-19 DIAGNOSIS — K621 Rectal polyp: Secondary | ICD-10-CM | POA: Diagnosis not present

## 2020-08-19 DIAGNOSIS — Z86010 Personal history of colonic polyps: Secondary | ICD-10-CM | POA: Diagnosis not present

## 2020-08-23 ENCOUNTER — Ambulatory Visit: Payer: BC Managed Care – PPO | Admitting: Nurse Practitioner

## 2020-09-08 ENCOUNTER — Encounter: Payer: Self-pay | Admitting: Internal Medicine

## 2020-09-30 ENCOUNTER — Ambulatory Visit: Payer: BC Managed Care – PPO | Admitting: Family Medicine

## 2020-10-15 ENCOUNTER — Other Ambulatory Visit: Payer: Self-pay | Admitting: Orthopaedic Surgery

## 2020-10-15 DIAGNOSIS — S83241A Other tear of medial meniscus, current injury, right knee, initial encounter: Secondary | ICD-10-CM

## 2020-10-15 DIAGNOSIS — M25561 Pain in right knee: Secondary | ICD-10-CM | POA: Diagnosis not present

## 2020-10-23 ENCOUNTER — Other Ambulatory Visit: Payer: Self-pay

## 2020-10-25 ENCOUNTER — Ambulatory Visit
Admission: RE | Admit: 2020-10-25 | Discharge: 2020-10-25 | Disposition: A | Discharge status: Home / Self Care | Payer: BC Managed Care – PPO | Source: Ambulatory Visit | Attending: Orthopaedic Surgery | Admitting: Orthopaedic Surgery

## 2020-10-25 ENCOUNTER — Other Ambulatory Visit: Payer: Self-pay

## 2020-10-25 DIAGNOSIS — M25461 Effusion, right knee: Secondary | ICD-10-CM | POA: Diagnosis not present

## 2020-10-25 DIAGNOSIS — R600 Localized edema: Secondary | ICD-10-CM | POA: Diagnosis not present

## 2020-10-25 DIAGNOSIS — S83241A Other tear of medial meniscus, current injury, right knee, initial encounter: Secondary | ICD-10-CM

## 2020-10-25 DIAGNOSIS — M1711 Unilateral primary osteoarthritis, right knee: Secondary | ICD-10-CM | POA: Diagnosis not present

## 2020-10-25 IMAGING — MR MR KNEE*R* W/O CM
4 of 6 series · 23 of 40 positions shown · non-contrast
Comparison: None.

CLINICAL DATA: Right knee locking sensation for 6 months. History
of arthroscopic knee surgery in [[facility]].

EXAM:
MRI OF THE RIGHT KNEE WITHOUT CONTRAST
TECHNIQUE: Multiplanar, multisequence MR imaging of the knee was performed. No
intravenous contrast was administered.

[Series 4: T1 · coronal · 4.0mm · 0.29mm/px · 5 of 26 slices shown]
[im 1/26]
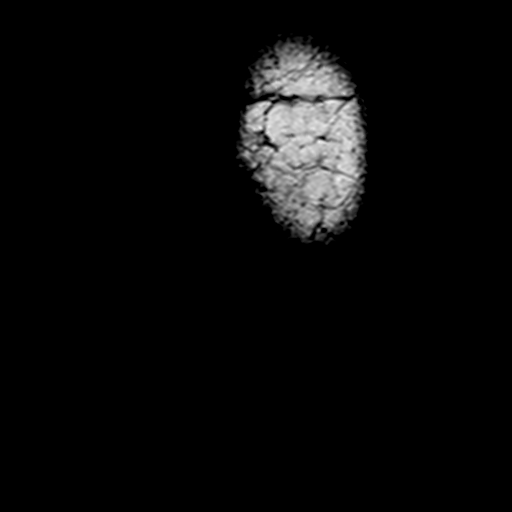
[im 5/26]
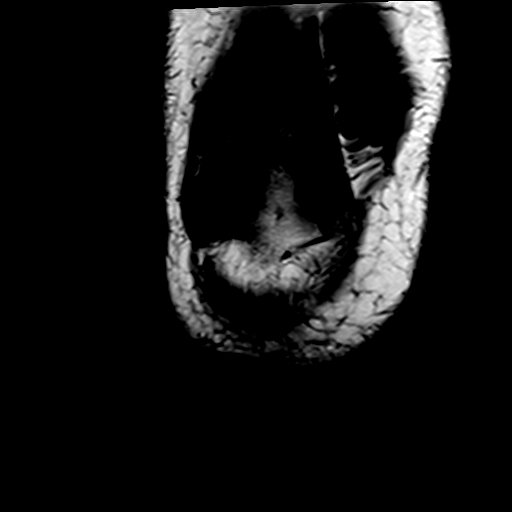
[im 9/26]
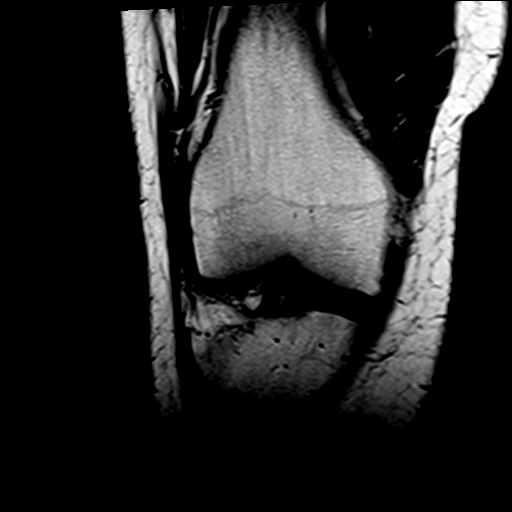
[im 13/26]
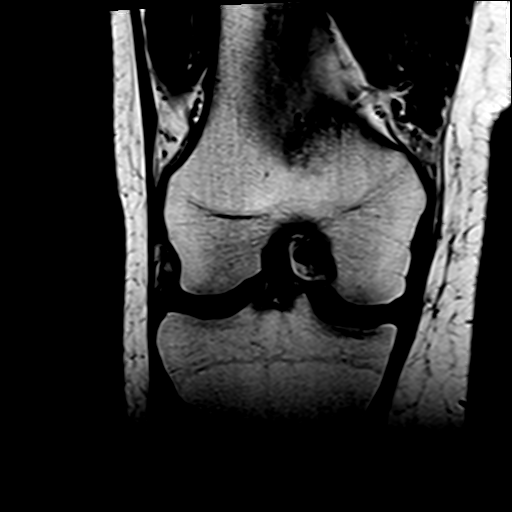
[im 21/26]
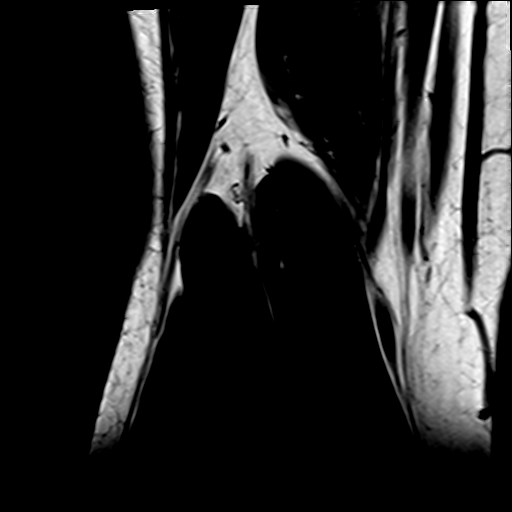

[Series 5: T2 fat-sat · coronal · 4.0mm · 0.59mm/px · 6 of 26 slices shown (1 of 2)]
[im 1/26]
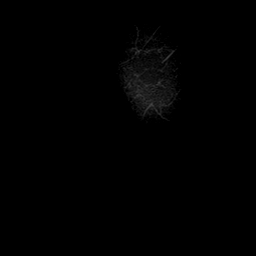
[im 6/26]
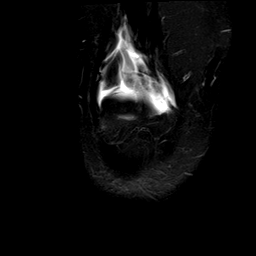
[im 11/26]
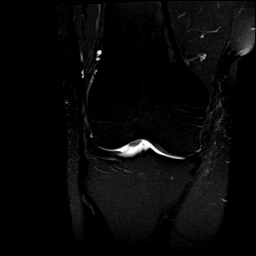
[im 16/26]
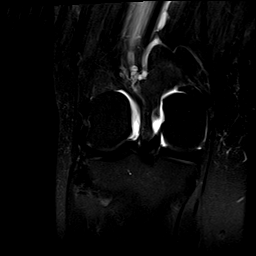
[im 21/26]
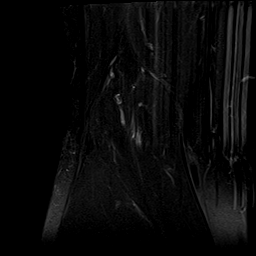
[im 26/26]
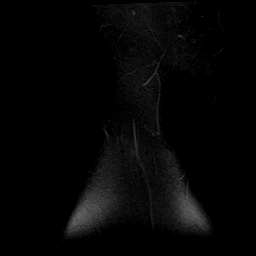

[Series 7: PD fat-sat · sagittal · 3.0mm · 0.29mm/px · 6 of 25 slices shown]
[im 1/25]
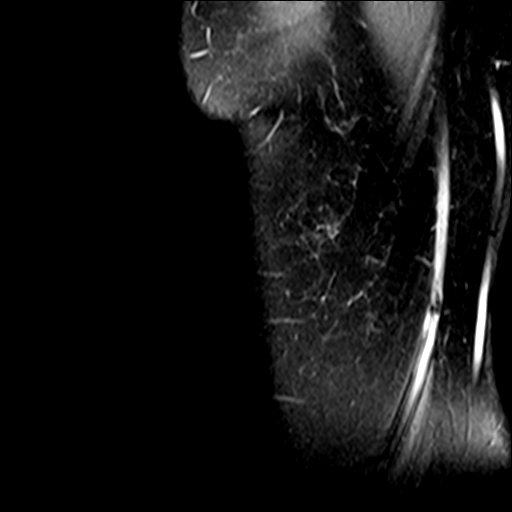
[im 5/25]
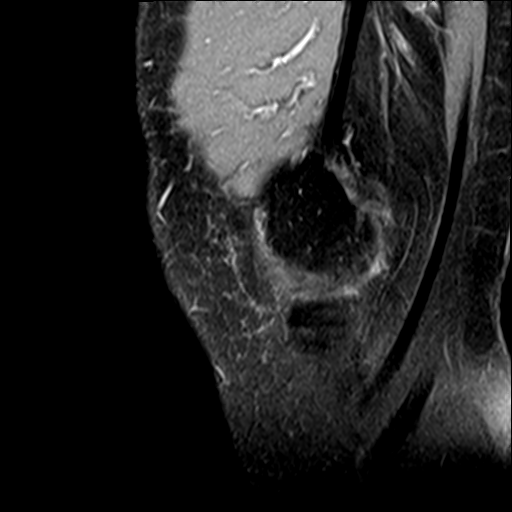
[im 10/25]
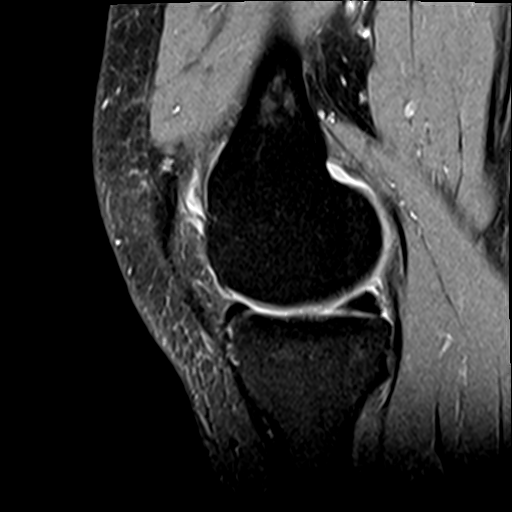
[im 15/25]
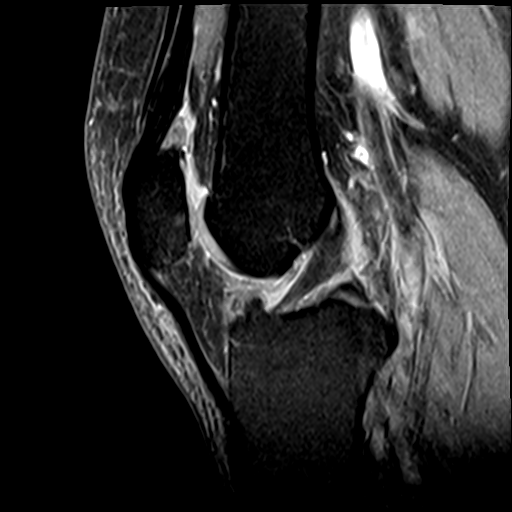
[im 20/25]
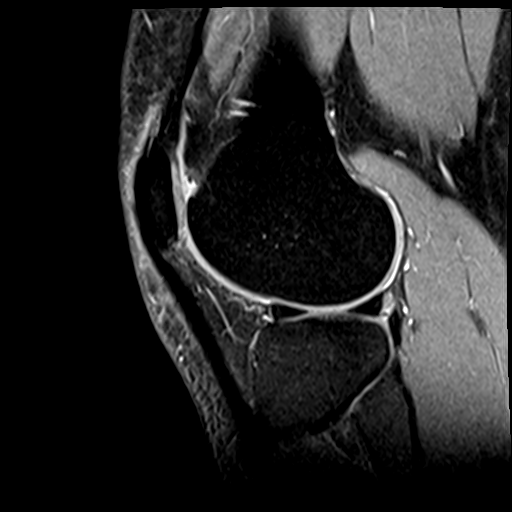
[im 25/25]
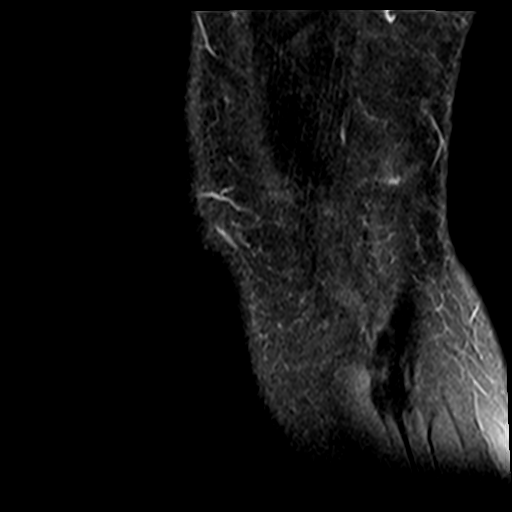

[Series 8: T2 fat-sat · sagittal · 3.0mm · 0.29mm/px · 6 of 25 slices shown (2 of 2)]
[im 1/25]
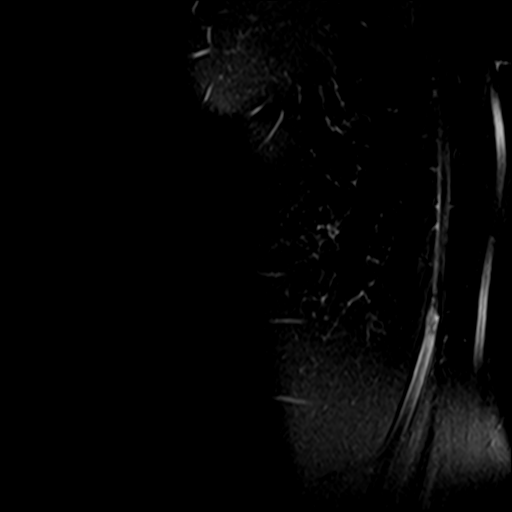
[im 5/25]
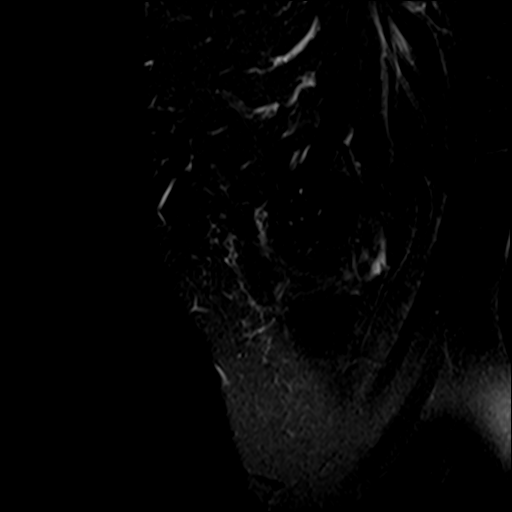
[im 10/25]
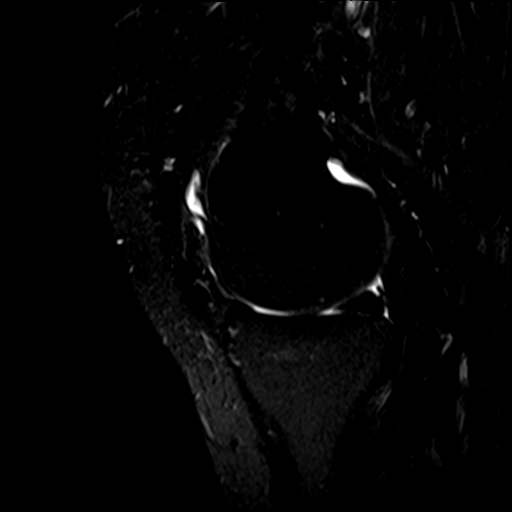
[im 15/25]
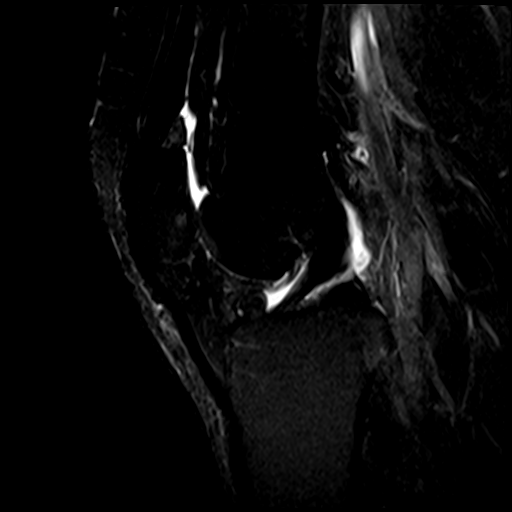
[im 20/25]
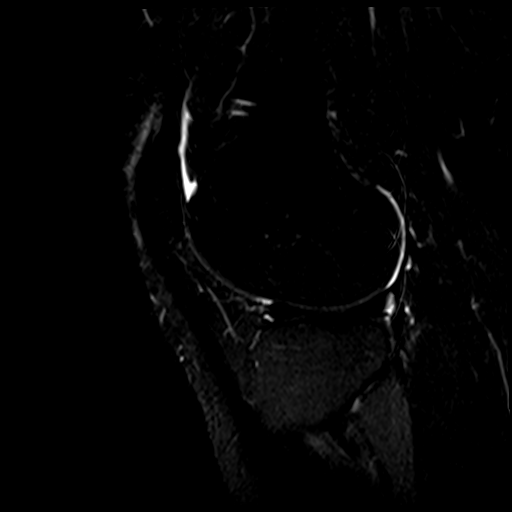
[im 25/25]
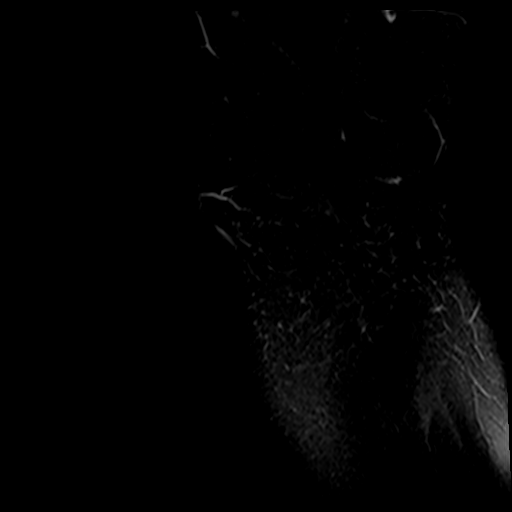

[23 of 40 positions shown; findings below may reference images not displayed]

FINDINGS: MENISCI

Medial meniscus:  Intact.

Lateral meniscus:  Intact.

LIGAMENTS

Cruciates:  Intact ACL and PCL.

Collaterals: Medial collateral ligament is intact. Lateral
collateral ligament complex is intact.

CARTILAGE

Patellofemoral: Chondral thinning and surface irregularity of the
patella most pronounced along the lateral patellar facet. No focal
cartilage defect.

Medial: Mild chondral thinning and surface irregularity of the
weight-bearing medial femoral condyle.

Lateral:  No chondral defect.

Joint: Small knee joint effusion. Suprapatellar fat pad is mildly
edematous. Hoffa's fat is unremarkable. No plical thickening.

Popliteal Fossa:  No Baker cyst. Intact popliteus tendon.

Extensor Mechanism:  Intact quadriceps tendon and patellar tendon.

Bones: Mild reactive subchondral marrow signal changes within the
patella. No fracture or dislocation. No suspicious bone lesion.

Other: None.
IMPRESSION: 1. Mild medial and patellofemoral compartment osteoarthritis.
2. Intact menisci and ligamentous structures.
3. Small knee joint effusion.

## 2020-11-02 DIAGNOSIS — M25561 Pain in right knee: Secondary | ICD-10-CM | POA: Diagnosis not present

## 2020-12-29 DIAGNOSIS — I10 Essential (primary) hypertension: Secondary | ICD-10-CM | POA: Diagnosis not present

## 2021-01-25 DIAGNOSIS — E785 Hyperlipidemia, unspecified: Secondary | ICD-10-CM | POA: Diagnosis not present

## 2021-04-05 DIAGNOSIS — Z6826 Body mass index (BMI) 26.0-26.9, adult: Secondary | ICD-10-CM | POA: Diagnosis not present

## 2021-04-05 DIAGNOSIS — Z01419 Encounter for gynecological examination (general) (routine) without abnormal findings: Secondary | ICD-10-CM | POA: Diagnosis not present

## 2021-06-29 DIAGNOSIS — E785 Hyperlipidemia, unspecified: Secondary | ICD-10-CM | POA: Diagnosis not present

## 2021-07-07 DIAGNOSIS — Z1331 Encounter for screening for depression: Secondary | ICD-10-CM | POA: Diagnosis not present

## 2021-07-07 DIAGNOSIS — Z1339 Encounter for screening examination for other mental health and behavioral disorders: Secondary | ICD-10-CM | POA: Diagnosis not present

## 2021-07-07 DIAGNOSIS — Z0000 Encounter for general adult medical examination without abnormal findings: Secondary | ICD-10-CM | POA: Diagnosis not present

## 2021-07-07 DIAGNOSIS — I10 Essential (primary) hypertension: Secondary | ICD-10-CM | POA: Diagnosis not present

## 2021-08-11 ENCOUNTER — Other Ambulatory Visit: Payer: Self-pay | Admitting: Orthopaedic Surgery

## 2021-08-11 ENCOUNTER — Other Ambulatory Visit: Payer: Self-pay | Admitting: Pediatrics

## 2021-08-11 DIAGNOSIS — M5416 Radiculopathy, lumbar region: Secondary | ICD-10-CM

## 2021-08-27 ENCOUNTER — Ambulatory Visit
Admission: RE | Admit: 2021-08-27 | Discharge: 2021-08-27 | Disposition: A | Discharge status: Home / Self Care | Payer: BC Managed Care – PPO | Source: Ambulatory Visit | Attending: Orthopaedic Surgery | Admitting: Orthopaedic Surgery

## 2021-08-27 DIAGNOSIS — M5416 Radiculopathy, lumbar region: Secondary | ICD-10-CM

## 2021-08-27 DIAGNOSIS — M5136 Other intervertebral disc degeneration, lumbar region: Secondary | ICD-10-CM | POA: Diagnosis not present

## 2021-08-27 DIAGNOSIS — M48061 Spinal stenosis, lumbar region without neurogenic claudication: Secondary | ICD-10-CM | POA: Diagnosis not present

## 2021-08-27 IMAGING — MR MR LUMBAR SPINE W/O CM
4 of 5 series · 26 of 48 positions shown · non-contrast
Comparison: None Available.

CLINICAL DATA: Radiculopathy, lumbar region [5F] ([5F]-CM).

EXAM:
MRI LUMBAR SPINE WITHOUT CONTRAST
TECHNIQUE: Multiplanar, multisequence MR imaging of the lumbar spine was
performed. No intravenous contrast was administered.

[Series 2: T2 · sagittal · 4.0mm · 0.53mm/px · 6 of 15 slices shown (1 of 2)]
[im 1/15]
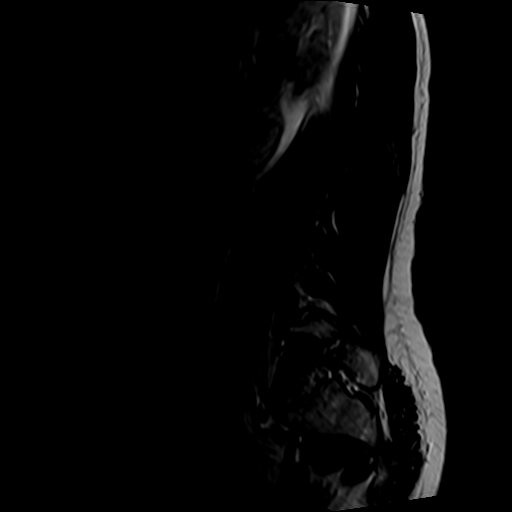
[im 3/15]
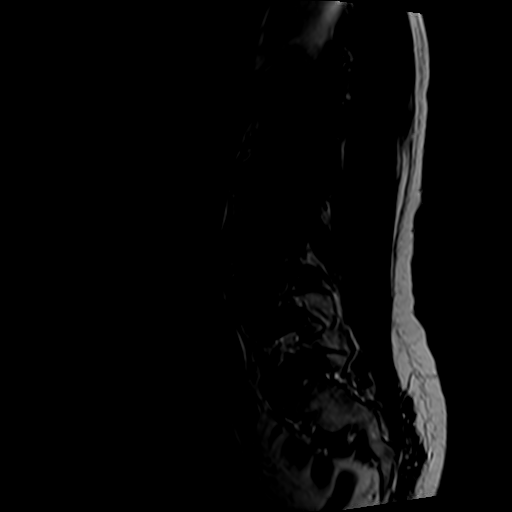
[im 6/15]
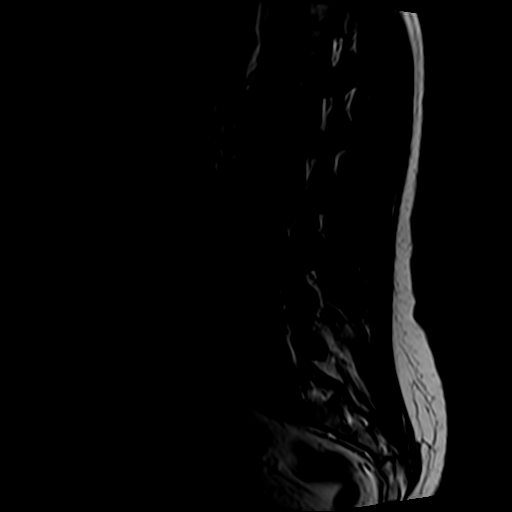
[im 9/15]
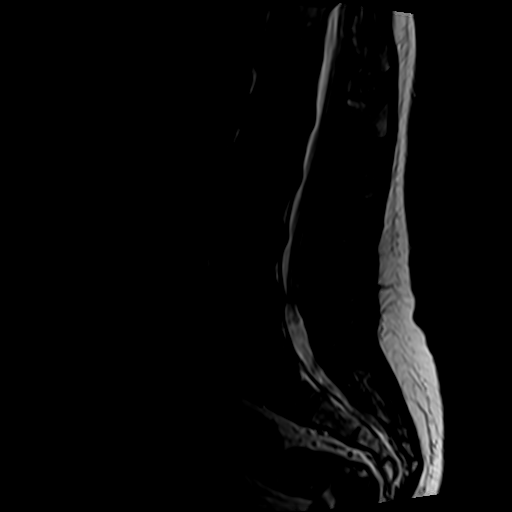
[im 12/15]
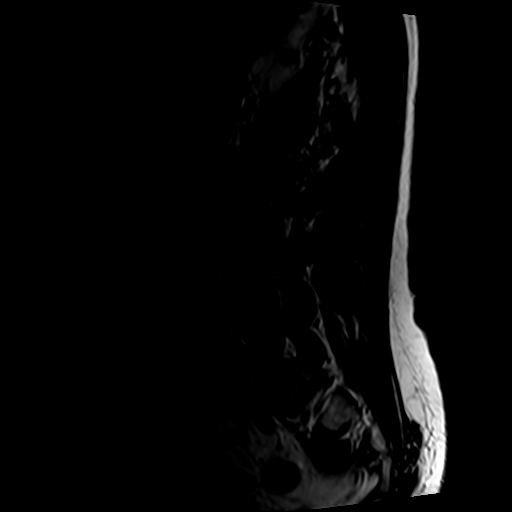
[im 15/15]
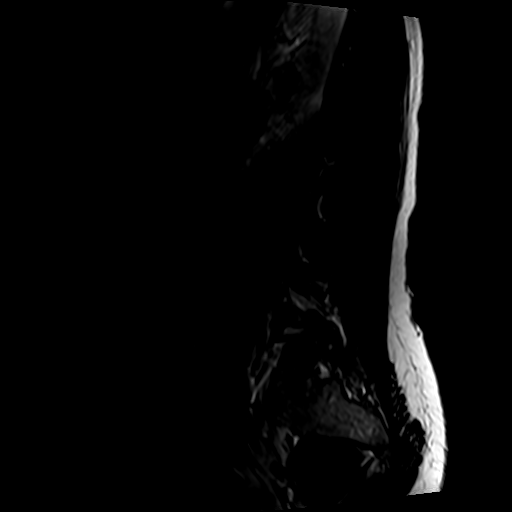

[Series 4: T1 · sagittal · 4.0mm · 0.53mm/px · 6 of 15 slices shown (1 of 2)]
[im 1/15]
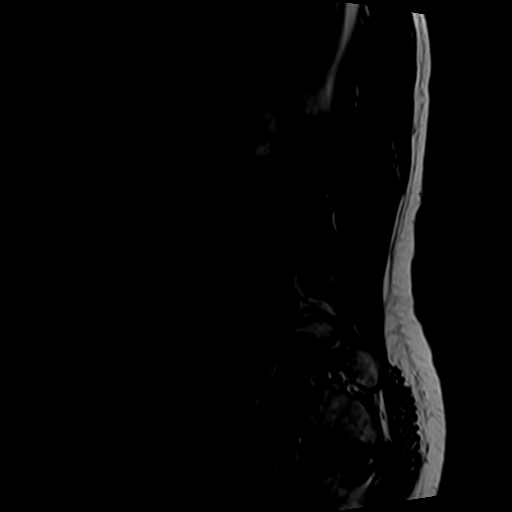
[im 3/15]
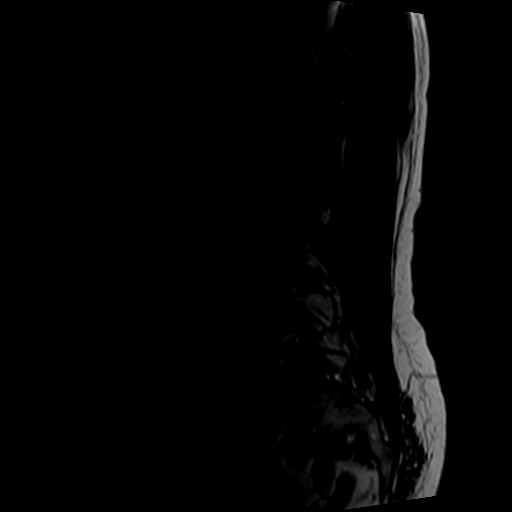
[im 6/15]
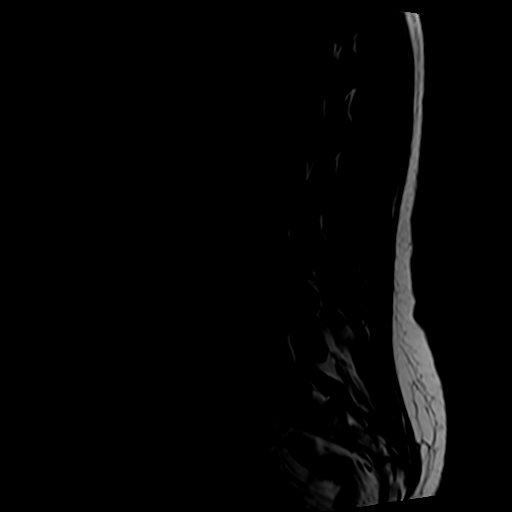
[im 9/15]
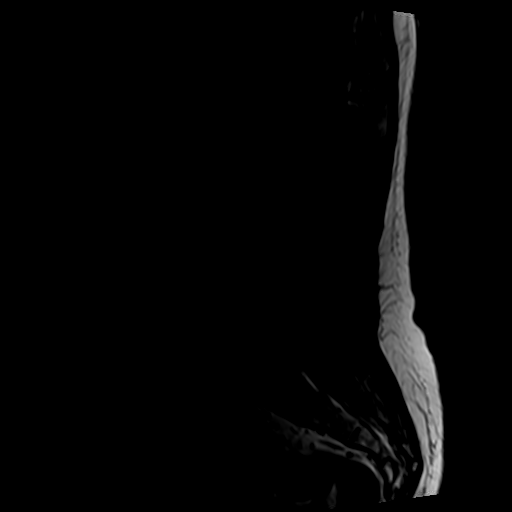
[im 12/15]
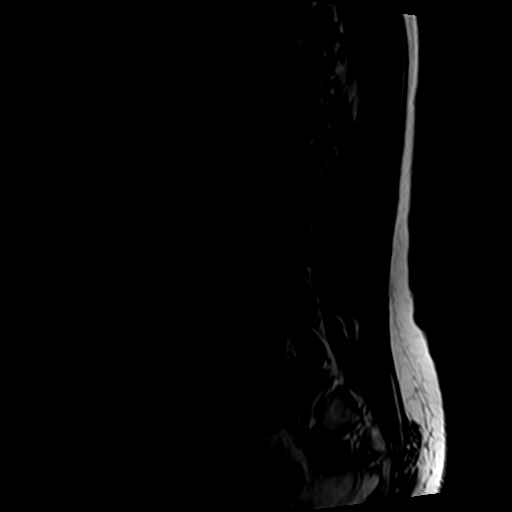
[im 15/15]
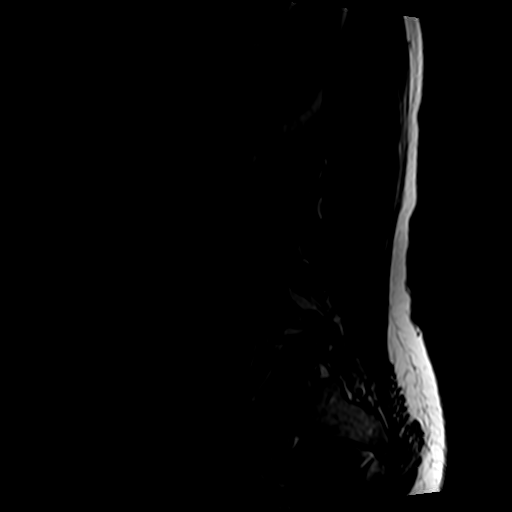

[Series 5: T2 · axial · 4.0mm · 0.70mm/px · z∈[-87,+124]mm · 9 of 37 slices shown (2 of 2)]
[im 1/37]
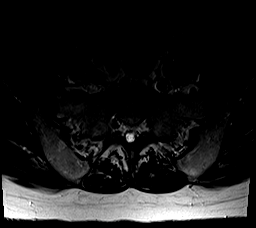
[im 6/37]
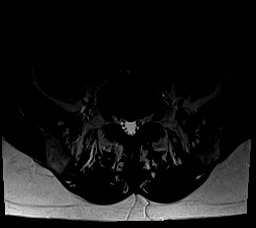
[im 11/37]
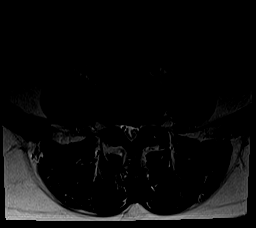
[im 16/37]
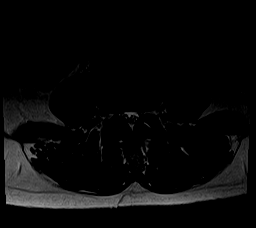
[im 19/37]
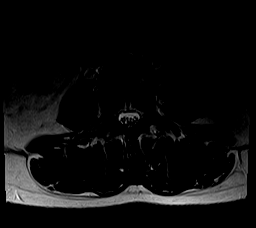
[im 21/37]
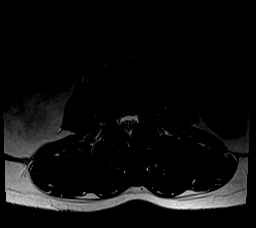
[im 26/37]
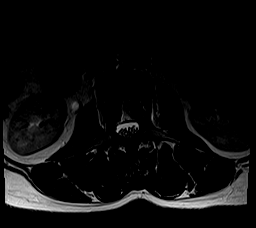
[im 31/37]
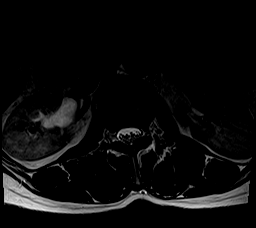
[im 37/37]
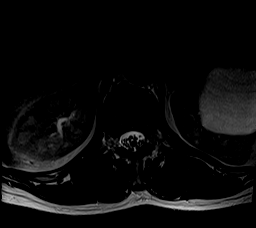

[Series 6: T1 · axial · 4.0mm · 0.35mm/px · z∈[-87,+93]mm · 5 of 37 slices shown (2 of 2)]
[im 1/37]
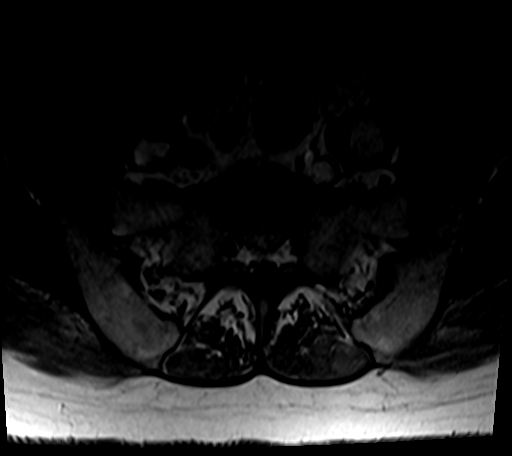
[im 6/37]
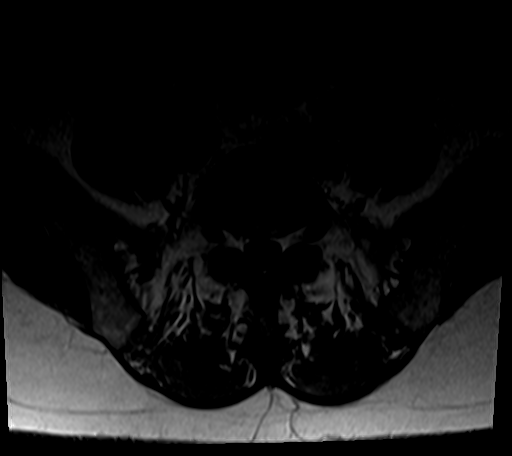
[im 11/37]
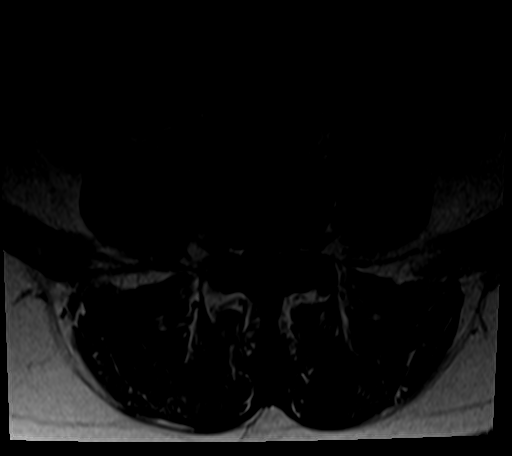
[im 19/37]
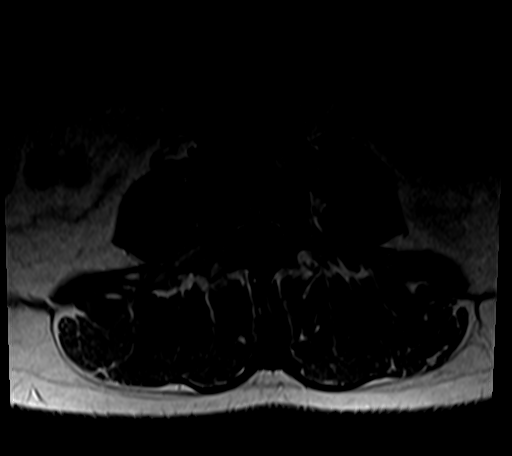
[im 31/37]
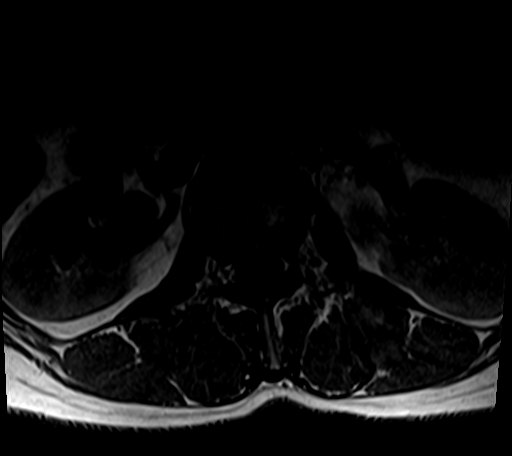

[26 of 48 positions shown; findings below may reference images not displayed]

FINDINGS: Segmentation:  5 lumbar type vertebrae

Alignment:  Physiologic.

Vertebrae: No fracture, evidence of discitis, or bone lesion.
Congenitally small spinal canal.

Conus medullaris and cauda equina: Conus extends to the T12-L1
level. Conus and cauda equina appear normal.

Paraspinal and other soft tissues: Partially included in least
images is a large cystic lesion in the left kidney measuring at
least 5.2 cm.

Disc levels:

T12-L1: No spinal or neural.

L1-2: No spinal canal or neural foraminal stenosis.

L2-3 shallow disc bulge and mild facet degenerative changes
resulting mild bilateral neural foraminal narrowing. No significant
spinal canal stenosis.

L3-4: Disc bulge and mild facet degenerative changes resulting in
mild spinal canal stenosis and mild bilateral neural foraminal
narrowing.

L4-5: Disc bulge with superimposed left subarticular disc protrusion
and mild facet degenerative changes with ligamentum flavum
redundancy resulting mild-to-moderate spinal canal stenosis with
narrowing of the bilateral subarticular zones, left greater than
right, and moderate bilateral neural foraminal narrowing.

L5-S1: Mild facet degenerative changes. No spinal canal or neural
foraminal stenosis.
IMPRESSION: Degenerative changes of the lumbar spine superimposed on a
congenitally small spinal canal resulting mild-to-moderate spinal
canal stenosis at L4-5 with narrowing of the bilateral subarticular
zones, greater on the left and moderate bilateral neural foraminal
narrowing.

## 2021-11-01 ENCOUNTER — Other Ambulatory Visit: Payer: Self-pay | Admitting: Gynecology

## 2021-11-01 DIAGNOSIS — Z13820 Encounter for screening for osteoporosis: Secondary | ICD-10-CM

## 2021-12-09 ENCOUNTER — Other Ambulatory Visit (HOSPITAL_COMMUNITY): Payer: Self-pay

## 2021-12-09 DIAGNOSIS — M25561 Pain in right knee: Secondary | ICD-10-CM | POA: Diagnosis not present

## 2021-12-09 MED ORDER — CELECOXIB 200 MG PO CAPS
200.0000 mg | ORAL_CAPSULE | Freq: Two times a day (BID) | ORAL | 2 refills | Status: AC
  Filled 2021-12-09: qty 60, 30d supply, fill #0

## 2021-12-29 ENCOUNTER — Ambulatory Visit
Admission: RE | Admit: 2021-12-29 | Discharge: 2021-12-29 | Disposition: A | Discharge status: Home / Self Care | Payer: BC Managed Care – PPO | Source: Ambulatory Visit | Attending: Gynecology | Admitting: Gynecology

## 2021-12-29 DIAGNOSIS — Z780 Asymptomatic menopausal state: Secondary | ICD-10-CM | POA: Diagnosis not present

## 2021-12-29 DIAGNOSIS — Z13820 Encounter for screening for osteoporosis: Secondary | ICD-10-CM

## 2022-01-04 ENCOUNTER — Telehealth: Payer: BC Managed Care – PPO | Admitting: Family Medicine

## 2022-01-04 DIAGNOSIS — U071 COVID-19: Secondary | ICD-10-CM

## 2022-01-04 MED ORDER — BENZONATATE 100 MG PO CAPS: 200.0000 mg | ORAL_CAPSULE | Freq: Two times a day (BID) | ORAL | 0 refills | Status: AC | PRN

## 2022-01-04 MED ORDER — MOLNUPIRAVIR EUA 200MG CAPSULE: 4.0000 | ORAL_CAPSULE | Freq: Two times a day (BID) | ORAL | 0 refills | Status: AC

## 2022-01-04 MED ORDER — ALBUTEROL SULFATE HFA 108 (90 BASE) MCG/ACT IN AERS: 2.0000 | INHALATION_SPRAY | Freq: Four times a day (QID) | RESPIRATORY_TRACT | 0 refills | Status: AC | PRN

## 2022-01-04 NOTE — Progress Notes (Signed)
Virtual Visit Consent   [name], you are scheduled for a virtual visit with a [facility] provider today. Just as with appointments in the office, your consent must be obtained to participate. Your consent will be active for this visit and any virtual visit you may have with one of our providers in the next 365 days. If you have a MyChart account, a copy of this consent can be sent to you electronically.  As this is a virtual visit, video technology does not allow for your provider to perform a traditional examination. This may limit your provider's ability to fully assess your condition. If your provider identifies any concerns that need to be evaluated in person or the need to arrange testing (such as labs, EKG, etc.), we will make arrangements to do so. Although advances in technology are sophisticated, we cannot ensure that it will always work on either your end or our end. If the connection with a video visit is poor, the visit may have to be switched to a telephone visit. With either a video or telephone visit, we are not always able to ensure that we have a secure connection.  By engaging in this virtual visit, you consent to the provision of healthcare and authorize for your insurance to be billed (if applicable) for the services provided during this visit. Depending on your insurance coverage, you may receive a charge related to this service.  I need to obtain your verbal consent now. Are you willing to proceed with your visit today? Terri Smith has provided verbal consent on 01/04/2022 for a virtual visit (video or telephone). Freddy Finner, NP  Date: 01/04/2022 10:16 AM  Virtual Visit via Video Note   I, Freddy Finner, connected with  Terri Smith  ([id number], 1965/08/08) on 01/04/22 at 10:15 AM EDT by a video-enabled telemedicine application and verified that I am speaking with the correct person using two identifiers.  Location: Patient: Virtual Visit Location Patient:  Home Provider: Virtual Visit Location Provider: Home Office   I discussed the limitations of evaluation and management by telemedicine and the availability of in person appointments. The patient expressed understanding and agreed to proceed.    History of Present Illness: Terri Smith is a 56 y.o. who identifies as a female who was assigned female at birth, and is being seen today for COVID + this morning. Symptoms started on Tuesday 01/03/2022. Started with tired, headache, fever T max- 100, nasal congestion, body aches, sore throat, cough- mild. Some chest tightness when she was laying down, but does not have it at this time. Denies ear pain, chest pain, shortness of breath.   Vaccines- COVID series and booster and Flu.   Problems: There are no problems to display for this patient.   Allergies: No Known Allergies Medications:  Current Outpatient Medications:    budesonide (RHINOCORT AQUA) 32 MCG/ACT nasal spray, Place 1 spray into both nostrils daily., Disp: , Rfl:    celecoxib (CELEBREX) 200 MG capsule, Take 1 capsule by mouth twice a day, Disp: 60 capsule, Rfl: 2   fexofenadine (ALLEGRA) 60 MG tablet, Take 60 mg by mouth daily., Disp: , Rfl:    lisinopril (ZESTRIL) 10 MG tablet, Take 1 tablet (10 mg total) by mouth daily., Disp: 90 tablet, Rfl: 1   progesterone (PROMETRIUM) 200 MG capsule, Take 300 mg by mouth daily. Take 1 tablet daily, Disp: , Rfl:    UNABLE TO FIND, Testosterone 20mg  propylene glycol  Take 3 drops once daily, Disp: ,  Rfl:   Observations/Objective: Patient is well-developed, well-nourished in no acute distress.  Resting comfortably  at home.  Head is normocephalic, atraumatic.  No labored breathing.  Speech is clear and coherent with logical content.  Patient is alert and oriented at baseline.    Assessment and Plan: 1. COVID-19  - molnupiravir EUA (LAGEVRIO) 200 mg CAPS capsule; Take 4 capsules (800 mg total) by mouth 2 (two) times daily for 5 days.   Dispense: 40 capsule; Refill: 0 - benzonatate (TESSALON) 100 MG capsule; Take 2 capsules (200 mg total) by mouth 2 (two) times daily as needed for cough.  Dispense: 20 capsule; Refill: 0 - albuterol (VENTOLIN HFA) 108 (90 Base) MCG/ACT inhaler; Inhale 2 puffs into the lungs every 6 (six) hours as needed for wheezing or shortness of breath.  Dispense: 8 g; Refill: 0  -rest -hydrate -covid info reviewed and on avs -OTC for symptom management on AVS   Reviewed side effects, risks and benefits of medication.    Patient acknowledged agreement and understanding of the plan.   Past Medical, Surgical, Social History, Allergies, and Medications have been Reviewed.   Follow Up Instructions: I discussed the assessment and treatment plan with the patient. The patient was provided an opportunity to ask questions and all were answered. The patient agreed with the plan and demonstrated an understanding of the instructions.  A copy of instructions were sent to the patient via MyChart unless otherwise noted below.     The patient was advised to call back or seek an in-person evaluation if the symptoms worsen or if the condition fails to improve as anticipated.  Time:  I spent 10 minutes with the patient via telehealth technology discussing the above problems/concerns.    Perlie Mayo, NP

## 2022-01-04 NOTE — Patient Instructions (Signed)
Dierdre Harness, thank you for joining Perlie Mayo, NP for today's virtual visit.  While this provider is not your primary care provider (PCP), if your PCP is located in our provider database this encounter information will be shared with them immediately following your visit.   [facility] account gives you access to today's visit and all your visits, tests, and labs performed at [facility] " click here if you don't have a [facility] account or go to [email or web address]  Consent: (Patient) Terri Smith provided verbal consent for this virtual visit at the beginning of the encounter.  Current Medications:  Current Outpatient Medications:    albuterol (VENTOLIN HFA) 108 (90 Base) MCG/ACT inhaler, Inhale 2 puffs into the lungs every 6 (six) hours as needed for wheezing or shortness of breath., Disp: 8 g, Rfl: 0   benzonatate (TESSALON) 100 MG capsule, Take 2 capsules (200 mg total) by mouth 2 (two) times daily as needed for cough., Disp: 20 capsule, Rfl: 0   molnupiravir EUA (LAGEVRIO) 200 mg CAPS capsule, Take 4 capsules (800 mg total) by mouth 2 (two) times daily for 5 days., Disp: 40 capsule, Rfl: 0   budesonide (RHINOCORT AQUA) 32 MCG/ACT nasal spray, Place 1 spray into both nostrils daily., Disp: , Rfl:    celecoxib (CELEBREX) 200 MG capsule, Take 1 capsule by mouth twice a day, Disp: 60 capsule, Rfl: 2   fexofenadine (ALLEGRA) 60 MG tablet, Take 60 mg by mouth daily., Disp: , Rfl:    lisinopril (ZESTRIL) 10 MG tablet, Take 1 tablet (10 mg total) by mouth daily., Disp: 90 tablet, Rfl: 1   progesterone (PROMETRIUM) 200 MG capsule, Take 300 mg by mouth daily. Take 1 tablet daily, Disp: , Rfl:    UNABLE TO FIND, Testosterone 20mg  propylene glycol  Take 3 drops once daily, Disp: , Rfl:    Medications ordered in this encounter:  Meds ordered this encounter  Medications   molnupiravir EUA (LAGEVRIO) 200 mg CAPS capsule    Sig: Take 4 capsules (800 mg  total) by mouth 2 (two) times daily for 5 days.    Dispense:  40 capsule    Refill:  0    Order Specific Question:   Supervising Provider    Answer:   Chase Picket [[id number]]   benzonatate (TESSALON) 100 MG capsule    Sig: Take 2 capsules (200 mg total) by mouth 2 (two) times daily as needed for cough.    Dispense:  20 capsule    Refill:  0    Order Specific Question:   Supervising Provider    Answer:   Bari Mantis   albuterol (VENTOLIN HFA) 108 (90 Base) MCG/ACT inhaler    Sig: Inhale 2 puffs into the lungs every 6 (six) hours as needed for wheezing or shortness of breath.    Dispense:  8 g    Refill:  0    Order Specific Question:   Supervising Provider    Answer:   Chase Picket [id number]     *If you need refills on other medications prior to your next appointment, please contact your pharmacy*  Follow-Up: Call back or seek an in-person evaluation if the symptoms worsen or if the condition fails to improve as anticipated.  [place] [phone]  Other Instructions Please keep well-hydrated and get plenty of rest. Start a saline nasal rinse to flush out your nasal passages. You can use plain Mucinex to help thin  congestion. If you have a humidifier, running in the bedroom at night. I want you to start OTC vitamin D3 1000 units daily, vitamin C 1000 mg daily, and a zinc supplement. Please take prescribed medications as directed.  You have been enrolled in a MyChart symptom monitoring program. Please answer these questions daily so we can keep track of how you are doing.  You were to quarantine for 5 days from onset of your symptoms.  After day 5, if you have had no fever and you are feeling better, you can end quarantine but need to mask for an additional 5 days. After day 5 if you have a fever or are having significant symptoms, please quarantine for full 10 days.  If you note any worsening of symptoms, any significant shortness of  breath or any chest pain, please seek ER evaluation ASAP.  Please do not delay care!  COVID-19: What to Do if You Are Sick If you test positive and are an older adult or someone who is at high risk of getting very sick from COVID-19, treatment may be available. Contact a healthcare provider right away after a positive test to determine if you are eligible, even if your symptoms are mild right now. You can also visit a Test to Treat location and, if eligible, receive a prescription from a provider. Don't delay: Treatment must be started within the first few days to be effective. If you have a fever, cough, or other symptoms, you might have COVID-19. Most people have mild illness and are able to recover at home. If you are sick: Keep track of your symptoms. If you have an emergency warning sign (including trouble breathing), call 911. Steps to help prevent the spread of COVID-19 if you are sick If you are sick with COVID-19 or think you might have COVID-19, follow the steps below to care for yourself and to help protect other people in your home and community. Stay home except to get medical care Stay home. Most people with COVID-19 have mild illness and can recover at home without medical care. Do not leave your home, except to get medical care. Do not visit public areas and do not go to places where you are unable to wear a mask. Take care of yourself. Get rest and stay hydrated. Take over-the-counter medicines, such as acetaminophen, to help you feel better. Stay in touch with your doctor. Call before you get medical care. Be sure to get care if you have trouble breathing, or have any other emergency warning signs, or if you think it is an emergency. Avoid public transportation, ride-sharing, or taxis if possible. Get tested If you have symptoms of COVID-19, get tested. While waiting for test results, stay away from others, including staying apart from those living in your household. Get tested as  soon as possible after your symptoms start. Treatments may be available for people with COVID-19 who are at risk for becoming very sick. Don't delay: Treatment must be started early to be effective--some treatments must begin within 5 days of your first symptoms. Contact your healthcare provider right away if your test result is positive to determine if you are eligible. Self-tests are one of several options for testing for the virus that causes COVID-19 and may be more convenient than laboratory-based tests and point-of-care tests. Ask your healthcare provider or your local health department if you need help interpreting your test results. You can visit your state, tribal, local, and territorial health department's website to look  for the latest local information on testing sites. Separate yourself from other people As much as possible, stay in a specific room and away from other people and pets in your home. If possible, you should use a separate bathroom. If you need to be around other people or animals in or outside of the home, wear a well-fitting mask. Tell your close contacts that they may have been exposed to COVID-19. An infected person can spread COVID-19 starting 48 hours (or 2 days) before the person has any symptoms or tests positive. By letting your close contacts know they may have been exposed to COVID-19, you are helping to protect everyone. See COVID-19 and Animals if you have questions about pets. If you are diagnosed with COVID-19, someone from the health department may call you. Answer the call to slow the spread. Monitor your symptoms Symptoms of COVID-19 include fever, cough, or other symptoms. Follow care instructions from your healthcare provider and local health department. Your local health authorities may give instructions on checking your symptoms and reporting information. When to seek emergency medical attention Look for emergency warning signs* for COVID-19. If someone is  showing any of these signs, seek emergency medical care immediately: Trouble breathing Persistent pain or pressure in the chest New confusion Inability to wake or stay awake Pale, gray, or blue-colored skin, lips, or nail beds, depending on skin tone *This list is not all possible symptoms. Please call your medical provider for any other symptoms that are severe or concerning to you. Call 911 or call ahead to your local emergency facility: Notify the operator that you are seeking care for someone who has or may have COVID-19. Call ahead before visiting your doctor Call ahead. Many medical visits for routine care are being postponed or done by phone or telemedicine. If you have a medical appointment that cannot be postponed, call your doctor's office, and tell them you have or may have COVID-19. This will help the office protect themselves and other patients. If you are sick, wear a well-fitting mask You should wear a mask if you must be around other people or animals, including pets (even at home). Wear a mask with the best fit, protection, and comfort for you. You don't need to wear the mask if you are alone. If you can't put on a mask (because of trouble breathing, for example), cover your coughs and sneezes in some other way. Try to stay at least 6 feet away from other people. This will help protect the people around you. Masks should not be placed on young children under age 22 years, anyone who has trouble breathing, or anyone who is not able to remove the mask without help. Cover your coughs and sneezes Cover your mouth and nose with a tissue when you cough or sneeze. Throw away used tissues in a lined trash can. Immediately wash your hands with soap and water for at least 20 seconds. If soap and water are not available, clean your hands with an alcohol-based hand sanitizer that contains at least 60% alcohol. Clean your hands often Wash your hands often with soap and water for at least 20  seconds. This is especially important after blowing your nose, coughing, or sneezing; going to the bathroom; and before eating or preparing food. Use hand sanitizer if soap and water are not available. Use an alcohol-based hand sanitizer with at least 60% alcohol, covering all surfaces of your hands and rubbing them together until they feel dry. Soap and water are  the best option, especially if hands are visibly dirty. Avoid touching your eyes, nose, and mouth with unwashed hands. Handwashing Tips Avoid sharing personal household items Do not share dishes, drinking glasses, cups, eating utensils, towels, or bedding with other people in your home. Wash these items thoroughly after using them with soap and water or put in the dishwasher. Clean surfaces in your home regularly Clean and disinfect high-touch surfaces (for example, doorknobs, tables, handles, light switches, and countertops) in your "sick room" and bathroom. In shared spaces, you should clean and disinfect surfaces and items after each use by the person who is ill. If you are sick and cannot clean, a caregiver or other person should only clean and disinfect the area around you (such as your bedroom and bathroom) on an as needed basis. Your caregiver/other person should wait as long as possible (at least several hours) and wear a mask before entering, cleaning, and disinfecting shared spaces that you use. Clean and disinfect areas that may have blood, stool, or body fluids on them. Use household cleaners and disinfectants. Clean visible dirty surfaces with household cleaners containing soap or detergent. Then, use a household disinfectant. Use a product from [employer] List N: Disinfectants for Coronavirus ([id number]). Be sure to follow the instructions on the label to ensure safe and effective use of the product. Many products recommend keeping the surface wet with a disinfectant for a certain period of time (look at "contact time" on the product  label). You may also need to wear personal protective equipment, such as gloves, depending on the directions on the product label. Immediately after disinfecting, wash your hands with soap and water for 20 seconds. For completed guidance on cleaning and disinfecting your home, visit Complete Disinfection Guidance. Take steps to improve ventilation at home Improve ventilation (air flow) at home to help prevent from spreading COVID-19 to other people in your household. Clear out COVID-19 virus particles in the air by opening windows, using air filters, and turning on fans in your home. Use this interactive tool to learn how to improve air flow in your home. When you can be around others after being sick with COVID-19 Deciding when you can be around others is different for different situations. Find out when you can safely end home isolation. For any additional questions about your care, contact your healthcare provider or state or local health department. 06/01/2020 Content source: [facility] ([facility]), Division of Viral Diseases This information is not intended to replace advice given to you by your health care provider. Make sure you discuss any questions you have with your health care provider. Document Revised: 07/15/2020 Document Reviewed: 07/15/2020 Elsevier Patient Education  2022 [employer].      If you have been instructed to have an in-person evaluation today at a local Urgent Care facility, please use the link below. It will take you to a list of all of our available [facility], including address, phone number and hours of operation. Please do not delay care.  [facility]  If you or a family member do not have a primary care provider, use the link below to schedule a visit and establish care. When you choose a [facility] primary care physician or advanced practice provider, you gain a long-term partner in  health. Find a Primary Care Provider  Learn more about [facility]'s in-office and virtual care options: [facility]

## 2022-05-05 ENCOUNTER — Telehealth: Payer: BC Managed Care – PPO | Admitting: Physician Assistant

## 2022-05-05 DIAGNOSIS — R6889 Other general symptoms and signs: Secondary | ICD-10-CM | POA: Diagnosis not present

## 2022-05-05 MED ORDER — BENZONATATE 100 MG PO CAPS: 100.0000 mg | ORAL_CAPSULE | Freq: Three times a day (TID) | ORAL | 0 refills | Status: AC | PRN

## 2022-05-05 MED ORDER — OSELTAMIVIR PHOSPHATE 75 MG PO CAPS: 75.0000 mg | ORAL_CAPSULE | Freq: Two times a day (BID) | ORAL | 0 refills | Status: AC

## 2022-05-05 NOTE — Progress Notes (Signed)
E visit for Flu like symptoms   We are sorry that you are not feeling well.  Here is how we plan to help! Based on what you have shared with me it looks like you may have a respiratory virus that may be influenza.  Influenza or "the flu" is   an infection caused by a respiratory virus. The flu virus is highly contagious and persons who did not receive their yearly flu vaccination may "catch" the flu from close contact.  We have anti-viral medications to treat the viruses that cause this infection. They are not a "cure" and only shorten the course of the infection. These prescriptions are most effective when they are given within the first 2 days of "flu" symptoms. Antiviral medication are indicated if you have a high risk of complications from the flu. You should  also consider an antiviral medication if you are in close contact with someone who is at risk. These medications can help patients avoid complications from the flu  but have side effects that you should know. Possible side effects from Tamiflu or oseltamivir include nausea, vomiting, diarrhea, dizziness, headaches, eye redness, sleep problems or other respiratory symptoms. You should not take Tamiflu if you have an allergy to oseltamivir or any to the ingredients in Tamiflu.  Based upon your symptoms and potential risk factors I have prescribed Oseltamivir (Tamiflu).  It has been sent to your designated pharmacy.  You will take one 75 mg capsule orally twice a day for the next 5 days.  ANYONE WHO HAS FLU SYMPTOMS SHOULD: Stay home. The flu is highly contagious and going out or to work exposes others! Be sure to drink plenty of fluids. Water is fine as well as fruit juices, sodas and electrolyte beverages. You may want to stay away from caffeine or alcohol. If you are nauseated, try taking small sips of liquids. How do you know if you are getting enough fluid? Your urine should be a pale yellow or almost colorless. Get rest. Taking a steamy  shower or using a humidifier may help nasal congestion and ease sore throat pain. Using a saline nasal spray works much the same way. Cough drops, hard candies and sore throat lozenges may ease your cough. Line up a caregiver. Have someone check on you regularly.   GET HELP RIGHT AWAY IF: You cannot keep down liquids or your medications. You become short of breath Your fell like you are going to pass out or loose consciousness. Your symptoms persist after you have completed your treatment plan MAKE SURE YOU  Understand these instructions. Will watch your condition. Will get help right away if you are not doing well or get worse.  Your e-visit answers were reviewed by a board certified advanced clinical practitioner to complete your personal care plan.  Depending on the condition, your plan could have included both over the counter or prescription medications.  If there is a problem please reply  once you have received a response from your provider.  Your safety is important to us.  If you have drug allergies check your prescription carefully.    You can use MyChart to ask questions about today's visit, request a non-urgent call back, or ask for a work or school excuse for 24 hours related to this e-Visit. If it has been greater than 24 hours you will need to follow up with your provider, or enter a new e-Visit to address those concerns.  You will get an e-mail in the next   two days asking about your experience.  I hope that your e-visit has been valuable and will speed your recovery. Thank you for using e-visits.   I have spent 5 minutes in review of e-visit questionnaire, review and updating patient chart, medical decision making and response to patient.   Chelle Cayton Z Ward, PA-C    

## 2022-06-02 ENCOUNTER — Other Ambulatory Visit (HOSPITAL_COMMUNITY): Payer: Self-pay

## 2022-06-02 DIAGNOSIS — S8001XA Contusion of right knee, initial encounter: Secondary | ICD-10-CM | POA: Diagnosis not present

## 2022-06-02 MED ORDER — CELECOXIB 200 MG PO CAPS
200.0000 mg | ORAL_CAPSULE | Freq: Two times a day (BID) | ORAL | 2 refills | Status: AC
  Filled 2022-06-02 – 2022-07-12 (×2): qty 60, 30d supply, fill #0
  Filled 2023-01-26: qty 60, 30d supply, fill #1
  Filled 2023-05-31: qty 60, 30d supply, fill #2

## 2022-06-12 ENCOUNTER — Other Ambulatory Visit (HOSPITAL_COMMUNITY): Payer: Self-pay

## 2022-06-14 ENCOUNTER — Telehealth: Payer: BC Managed Care – PPO | Admitting: Nurse Practitioner

## 2022-06-14 DIAGNOSIS — B001 Herpesviral vesicular dermatitis: Secondary | ICD-10-CM

## 2022-06-14 MED ORDER — ACYCLOVIR 800 MG PO TABS: 800.0000 mg | ORAL_TABLET | Freq: Two times a day (BID) | ORAL | 0 refills | Status: AC

## 2022-06-14 NOTE — Progress Notes (Signed)
We are sorry that you are not feeling well.  Here is how we plan to help!  Based on what you have shared with me it does look like you have a viral infection.    Most cold sores or fever blisters are small fluid filled blisters around the mouth caused by herpes simplex virus.  The most common strain of the virus causing cold sores is herpes simplex virus 1.  It can be spread by skin contact, sharing eating utensils, or even sharing towels.  Cold sores are contagious to other people until dry. (Approximately 5-7 days).  Wash your hands. You can spread the virus to your eyes through handling your contact lenses after touching the lesions.  Most people experience pain at the sight or tingling sensations in their lips that may begin before the ulcers erupt.  Herpes simplex is treatable but not curable.  It may lie dormant for a long time and then reappear due to stress or prolonged sun exposure.  Many patients have success in treating their cold sores with an over the counter topical called Abreva.  You may apply the cream up to 5 times daily (maximum 10 days) until healing occurs.  If you would like to use an oral antiviral medication to speed the healing of your cold sore, I have sent a prescription to your local pharmacy Acyclovir 800 mg take one by mouth twice a day for 7 days    HOME CARE:  Wash your hands frequently. Do not pick at or rub the sore. Don't open the blisters. Avoid kissing other people during this time. Avoid sharing drinking glasses, eating utensils, or razors. Do not handle contact lenses unless you have thoroughly washed your hands with soap and warm water! Avoid oral sex during this time.  Herpes from sores on your mouth can spread to your partner's genital area. Avoid contact with anyone who has eczema or a weakened immune system. Cold sores are often triggered by exposure to intense sunlight, use a lip balm containing a sunscreen (SPF 30 or higher).  GET HELP RIGHT AWAY  IF:  Blisters look infected. Blisters occur near or in the eye. Symptoms last longer than 10 days. Your symptoms become worse.  MAKE SURE YOU:  Understand these instructions. Will watch your condition. Will get help right away if you are not doing well or get worse.    Your e-visit answers were reviewed by a board certified advanced clinical practitioner to complete your personal care plan.  Depending upon the condition, your plan could have  Included both over the counter or prescription medications.    Please review your pharmacy choice.  Be sure that the pharmacy you have chosen is open so that you can pick up your prescription now.  If there is a problem you can message your provider in MyChart to have the prescription routed to another pharmacy.    Your safety is important to us.  If you have drug allergies check our prescription carefully.  For the next 24 hours you can use MyChart to ask questions about today's visit, request a non-urgent call back, or ask for a work or school excuse from your e-visit provider.  You will get an email in the next two days asking about your experience.  I hope that your e-visit has been valuable and will speed your recovery.   Meds ordered this encounter  Medications   acyclovir (ZOVIRAX) 800 MG tablet    Sig: Take 1 tablet (800 mg   total) by mouth 2 (two) times daily for 7 days.    Dispense:  14 tablet    Refill:  0    I spent approximately 5 minutes reviewing the patient's history, current symptoms and coordinating their care today.   

## 2022-07-05 DIAGNOSIS — I10 Essential (primary) hypertension: Secondary | ICD-10-CM | POA: Diagnosis not present

## 2022-07-05 DIAGNOSIS — Z0000 Encounter for general adult medical examination without abnormal findings: Secondary | ICD-10-CM | POA: Diagnosis not present

## 2022-07-05 DIAGNOSIS — E785 Hyperlipidemia, unspecified: Secondary | ICD-10-CM | POA: Diagnosis not present

## 2022-07-06 DIAGNOSIS — Z1339 Encounter for screening examination for other mental health and behavioral disorders: Secondary | ICD-10-CM | POA: Diagnosis not present

## 2022-07-06 DIAGNOSIS — I10 Essential (primary) hypertension: Secondary | ICD-10-CM | POA: Diagnosis not present

## 2022-07-06 DIAGNOSIS — Z0000 Encounter for general adult medical examination without abnormal findings: Secondary | ICD-10-CM | POA: Diagnosis not present

## 2022-07-06 DIAGNOSIS — Z1331 Encounter for screening for depression: Secondary | ICD-10-CM | POA: Diagnosis not present

## 2022-07-12 ENCOUNTER — Other Ambulatory Visit (HOSPITAL_COMMUNITY): Payer: Self-pay

## 2022-10-13 ENCOUNTER — Other Ambulatory Visit: Payer: Self-pay | Admitting: Orthopaedic Surgery

## 2022-10-13 DIAGNOSIS — S83241A Other tear of medial meniscus, current injury, right knee, initial encounter: Secondary | ICD-10-CM

## 2022-11-04 ENCOUNTER — Other Ambulatory Visit: Payer: BC Managed Care – PPO

## 2022-11-12 ENCOUNTER — Encounter: Payer: BC Managed Care – PPO | Admitting: Nurse Practitioner

## 2022-11-12 ENCOUNTER — Telehealth: Payer: BC Managed Care – PPO | Admitting: Nurse Practitioner

## 2022-11-12 DIAGNOSIS — U071 COVID-19: Secondary | ICD-10-CM | POA: Diagnosis not present

## 2022-11-12 MED ORDER — MOLNUPIRAVIR EUA 200MG CAPSULE: 4.0000 | ORAL_CAPSULE | Freq: Two times a day (BID) | ORAL | 0 refills | Status: AC

## 2022-11-12 MED ORDER — IPRATROPIUM BROMIDE 0.03 % NA SOLN: 2.0000 | Freq: Two times a day (BID) | NASAL | 0 refills | Status: AC

## 2022-11-12 NOTE — Patient Instructions (Signed)
Terri Smith, thank you for joining Claiborne Rigg, NP for today's virtual visit.  While this provider is not your primary care provider (PCP), if your PCP is located in our provider database this encounter information will be shared with them immediately following your visit.   A [facility] MyChart account gives you access to today's visit and all your visits, tests, and labs performed at [facility] " click here if you don't have a [facility] MyChart account or go to [email or web address]  Consent: (Patient) Elloise Thum provided verbal consent for this virtual visit at the beginning of the encounter.  Current Medications:  Current Outpatient Medications:    ipratropium (ATROVENT) 0.03 % nasal spray, Place 2 sprays into both nostrils every 12 (twelve) hours., Disp: 30 mL, Rfl: 0   molnupiravir EUA (LAGEVRIO) 200 mg CAPS capsule, Take 4 capsules (800 mg total) by mouth 2 (two) times daily for 5 days., Disp: 40 capsule, Rfl: 0   albuterol (VENTOLIN HFA) 108 (90 Base) MCG/ACT inhaler, Inhale 2 puffs into the lungs every 6 (six) hours as needed for wheezing or shortness of breath., Disp: 8 g, Rfl: 0   benzonatate (TESSALON) 100 MG capsule, Take 2 capsules (200 mg total) by mouth 2 (two) times daily as needed for cough., Disp: 20 capsule, Rfl: 0   benzonatate (TESSALON) 100 MG capsule, Take 1 capsule (100 mg total) by mouth 3 (three) times daily as needed for cough., Disp: 20 capsule, Rfl: 0   budesonide (RHINOCORT AQUA) 32 MCG/ACT nasal spray, Place 1 spray into both nostrils daily., Disp: , Rfl:    celecoxib (CELEBREX) 200 MG capsule, Take 1 capsule by mouth twice a day, Disp: 60 capsule, Rfl: 2   celecoxib (CELEBREX) 200 MG capsule, Take 1 capsule by mouth twice a day, Disp: 60 capsule, Rfl: 2   fexofenadine (ALLEGRA) 60 MG tablet, Take 60 mg by mouth daily., Disp: , Rfl:    lisinopril (ZESTRIL) 10 MG tablet, Take 1 tablet (10 mg total) by mouth daily., Disp: 90 tablet, Rfl:  1   progesterone (PROMETRIUM) 200 MG capsule, Take 300 mg by mouth daily. Take 1 tablet daily, Disp: , Rfl:    UNABLE TO FIND, Testosterone 20mg  propylene glycol  Take 3 drops once daily, Disp: , Rfl:    Medications ordered in this encounter:  Meds ordered this encounter  Medications   molnupiravir EUA (LAGEVRIO) 200 mg CAPS capsule    Sig: Take 4 capsules (800 mg total) by mouth 2 (two) times daily for 5 days.    Dispense:  40 capsule    Refill:  0    Order Specific Question:   Supervising Provider    Answer:   Merrilee Jansky [[id number]]   ipratropium (ATROVENT) 0.03 % nasal spray    Sig: Place 2 sprays into both nostrils every 12 (twelve) hours.    Dispense:  30 mL    Refill:  0    Order Specific Question:   Supervising Provider    Answer:   Merrilee Jansky [[id number]]     *If you need refills on other medications prior to your next appointment, please contact your pharmacy*  Follow-Up: Call back or seek an in-person evaluation if the symptoms worsen or if the condition fails to improve as anticipated.   Virtual Care [phone]  Other Instructions  Please keep well-hydrated and get plenty of rest. Start a saline nasal rinse to flush out your nasal passages. You can use plain Mucinex to  help thin congestion. If you have a humidifier, you can use this daily as needed.    You are to wear a mask for 5 days from onset of your symptoms.  After day 5, if you have had no fever and you are feeling better with NO symptoms, you can end masking. Keep in mind you can be contagious 10 days from the onset of symptoms  After day 5 if you have a fever or are having significant symptoms, please wear your mask for full 10 days.   If you note any worsening of symptoms, any significant shortness of breath or any chest pain, please seek ER evaluation ASAP.  Please do not delay care!    If you note any worsening of symptoms, any significant shortness of breath or any chest pain,  please seek ER evaluation ASAP.  Please do not delay care!    If you have been instructed to have an in-person evaluation today at a local Urgent Care facility, please use the link below. It will take you to a list of all of our available [facility], including address, phone number and hours of operation. Please do not delay care.  [facility]  If you or a family member do not have a primary care provider, use the link below to schedule a visit and establish care. When you choose a [facility] primary care physician or advanced practice provider, you gain a long-term partner in health. Find a Primary Care Provider  Learn more about [facility]'s in-office and virtual care options: [facility] - Get Care Now

## 2022-11-12 NOTE — Progress Notes (Signed)
Virtual Visit Consent   [name], you are scheduled for a virtual visit with a [facility] provider today. Just as with appointments in the office, your consent must be obtained to participate. Your consent will be active for this visit and any virtual visit you may have with one of our providers in the next 365 days. If you have a MyChart account, a copy of this consent can be sent to you electronically.  As this is a virtual visit, video technology does not allow for your provider to perform a traditional examination. This may limit your provider's ability to fully assess your condition. If your provider identifies any concerns that need to be evaluated in person or the need to arrange testing (such as labs, EKG, etc.), we will make arrangements to do so. Although advances in technology are sophisticated, we cannot ensure that it will always work on either your end or our end. If the connection with a video visit is poor, the visit may have to be switched to a telephone visit. With either a video or telephone visit, we are not always able to ensure that we have a secure connection.  By engaging in this virtual visit, you consent to the provision of healthcare and authorize for your insurance to be billed (if applicable) for the services provided during this visit. Depending on your insurance coverage, you may receive a charge related to this service.  I need to obtain your verbal consent now. Are you willing to proceed with your visit today? Adaliz Dowse has provided verbal consent on 11/12/2022 for a virtual visit (video or telephone). Claiborne Rigg, NP  Date: 11/12/2022 6:27 PM  Virtual Visit via Video Note   I, Claiborne Rigg, connected with  Gurtie Bialy  ([id number], April 02, 1965) on 11/12/22 at  6:30 PM EDT by a video-enabled telemedicine application and verified that I am speaking with the correct person using two identifiers.  Location: Patient: Virtual Visit Location Patient:  Home Provider: Virtual Visit Location Provider: Home Office   I discussed the limitations of evaluation and management by telemedicine and the availability of in person appointments. The patient expressed understanding and agreed to proceed.    History of Present Illness: Terri Smith is a 57 y.o. who identifies as a female who was assigned female at birth, and is being seen today for COVID positive.   [name] tested positive for COVID today. She notes the following symptoms with onset yesterday: cough,Fatigue, congestion. She denies cough, chest pain, headache.   Problems:  Patient Active Problem List   Diagnosis Date Noted   At high risk for breast cancer 02/12/2020    Allergies: No Known Allergies Medications:  Current Outpatient Medications:    ipratropium (ATROVENT) 0.03 % nasal spray, Place 2 sprays into both nostrils every 12 (twelve) hours., Disp: 30 mL, Rfl: 0   molnupiravir EUA (LAGEVRIO) 200 mg CAPS capsule, Take 4 capsules (800 mg total) by mouth 2 (two) times daily for 5 days., Disp: 40 capsule, Rfl: 0   albuterol (VENTOLIN HFA) 108 (90 Base) MCG/ACT inhaler, Inhale 2 puffs into the lungs every 6 (six) hours as needed for wheezing or shortness of breath., Disp: 8 g, Rfl: 0   benzonatate (TESSALON) 100 MG capsule, Take 2 capsules (200 mg total) by mouth 2 (two) times daily as needed for cough., Disp: 20 capsule, Rfl: 0   benzonatate (TESSALON) 100 MG capsule, Take 1 capsule (100 mg total) by mouth 3 (three) times daily as needed  for cough., Disp: 20 capsule, Rfl: 0   budesonide (RHINOCORT AQUA) 32 MCG/ACT nasal spray, Place 1 spray into both nostrils daily., Disp: , Rfl:    celecoxib (CELEBREX) 200 MG capsule, Take 1 capsule by mouth twice a day, Disp: 60 capsule, Rfl: 2   celecoxib (CELEBREX) 200 MG capsule, Take 1 capsule by mouth twice a day, Disp: 60 capsule, Rfl: 2   fexofenadine (ALLEGRA) 60 MG tablet, Take 60 mg by mouth daily., Disp: , Rfl:    lisinopril (ZESTRIL)  10 MG tablet, Take 1 tablet (10 mg total) by mouth daily., Disp: 90 tablet, Rfl: 1   progesterone (PROMETRIUM) 200 MG capsule, Take 300 mg by mouth daily. Take 1 tablet daily, Disp: , Rfl:    UNABLE TO FIND, Testosterone 20mg  propylene glycol  Take 3 drops once daily, Disp: , Rfl:   Observations/Objective: Patient is well-developed, well-nourished in no acute distress.  Resting comfortably at home.  Head is normocephalic, atraumatic.  No labored breathing.  Speech is clear and coherent with logical content.  Patient is alert and oriented at baseline.    Assessment and Plan: 1. Positive self-administered antigen test for COVID-19 - molnupiravir EUA (LAGEVRIO) 200 mg CAPS capsule; Take 4 capsules (800 mg total) by mouth 2 (two) times daily for 5 days.  Dispense: 40 capsule; Refill: 0 - ipratropium (ATROVENT) 0.03 % nasal spray; Place 2 sprays into both nostrils every 12 (twelve) hours.  Dispense: 30 mL; Refill: 0   Please keep well-hydrated and get plenty of rest. Start a saline nasal rinse to flush out your nasal passages. You can use plain Mucinex to help thin congestion. If you have a humidifier, you can use this daily as needed.    You are to wear a mask for 5 days from onset of your symptoms.  After day 5, if you have had no fever and you are feeling better with NO symptoms, you can end masking. Keep in mind you can be contagious 10 days from the onset of symptoms  After day 5 if you have a fever or are having significant symptoms, please wear your mask for full 10 days.   If you note any worsening of symptoms, any significant shortness of breath or any chest pain, please seek ER evaluation ASAP.  Please do not delay care!    If you note any worsening of symptoms, any significant shortness of breath or any chest pain, please seek ER evaluation ASAP.  Please do not delay care!   Follow Up Instructions: I discussed the assessment and treatment plan with the patient. The patient was  provided an opportunity to ask questions and all were answered. The patient agreed with the plan and demonstrated an understanding of the instructions.  A copy of instructions were sent to the patient via MyChart unless otherwise noted below.    The patient was advised to call back or seek an in-person evaluation if the symptoms worsen or if the condition fails to improve as anticipated.  Time:  I spent 12 minutes with the patient via telehealth technology discussing the above problems/concerns.    Claiborne Rigg, NP

## 2022-11-12 NOTE — Progress Notes (Signed)
Switched to virtual

## 2023-02-11 ENCOUNTER — Other Ambulatory Visit: Payer: BC Managed Care – PPO

## 2023-02-17 ENCOUNTER — Ambulatory Visit
Admission: RE | Admit: 2023-02-17 | Discharge: 2023-02-17 | Disposition: A | Discharge status: Home / Self Care | Payer: BC Managed Care – PPO | Source: Ambulatory Visit | Attending: Orthopaedic Surgery | Admitting: Orthopaedic Surgery

## 2023-02-17 DIAGNOSIS — M94261 Chondromalacia, right knee: Secondary | ICD-10-CM | POA: Diagnosis not present

## 2023-02-17 DIAGNOSIS — S83241A Other tear of medial meniscus, current injury, right knee, initial encounter: Secondary | ICD-10-CM | POA: Diagnosis not present

## 2023-02-17 DIAGNOSIS — M25461 Effusion, right knee: Secondary | ICD-10-CM | POA: Diagnosis not present

## 2023-02-21 ENCOUNTER — Other Ambulatory Visit: Payer: BC Managed Care – PPO

## 2023-03-19 ENCOUNTER — Other Ambulatory Visit (HOSPITAL_COMMUNITY): Payer: Self-pay

## 2023-03-19 DIAGNOSIS — M1711 Unilateral primary osteoarthritis, right knee: Secondary | ICD-10-CM | POA: Diagnosis not present

## 2023-03-19 DIAGNOSIS — Y999 Unspecified external cause status: Secondary | ICD-10-CM | POA: Diagnosis not present

## 2023-03-19 DIAGNOSIS — M228X1 Other disorders of patella, right knee: Secondary | ICD-10-CM | POA: Diagnosis not present

## 2023-03-19 DIAGNOSIS — X58XXXA Exposure to other specified factors, initial encounter: Secondary | ICD-10-CM | POA: Diagnosis not present

## 2023-03-19 DIAGNOSIS — S83241A Other tear of medial meniscus, current injury, right knee, initial encounter: Secondary | ICD-10-CM | POA: Diagnosis not present

## 2023-03-19 DIAGNOSIS — G8918 Other acute postprocedural pain: Secondary | ICD-10-CM | POA: Diagnosis not present

## 2023-03-19 DIAGNOSIS — S83231A Complex tear of medial meniscus, current injury, right knee, initial encounter: Secondary | ICD-10-CM | POA: Diagnosis not present

## 2023-03-19 MED ORDER — ACETAMINOPHEN EXTRA STRENGTH 500 MG PO TABS
1000.0000 mg | ORAL_TABLET | Freq: Three times a day (TID) | ORAL | 0 refills | Status: AC | PRN
  Filled 2023-03-19: qty 100, 17d supply, fill #0

## 2023-03-19 MED ORDER — CELECOXIB 200 MG PO CAPS
200.0000 mg | ORAL_CAPSULE | Freq: Two times a day (BID) | ORAL | 2 refills | Status: AC
  Filled 2023-03-19: qty 60, 30d supply, fill #0

## 2023-03-19 MED ORDER — PANTOPRAZOLE SODIUM 20 MG PO TBEC
20.0000 mg | DELAYED_RELEASE_TABLET | Freq: Every day | ORAL | 0 refills | Status: AC
  Filled 2023-03-19: qty 30, 30d supply, fill #0

## 2023-03-19 MED ORDER — ONDANSETRON 4 MG PO TBDP
4.0000 mg | ORAL_TABLET | Freq: Three times a day (TID) | ORAL | 0 refills | Status: AC | PRN
  Filled 2023-03-19: qty 10, 4d supply, fill #0

## 2023-03-19 MED ORDER — ASPIRIN 81 MG PO CHEW
81.0000 mg | CHEWABLE_TABLET | Freq: Two times a day (BID) | ORAL | 0 refills | Status: AC
  Filled 2023-03-19: qty 90, 45d supply, fill #0

## 2023-03-19 MED ORDER — TRAMADOL HCL 50 MG PO TABS
50.0000 mg | ORAL_TABLET | Freq: Four times a day (QID) | ORAL | 0 refills | Status: AC | PRN
  Filled 2023-03-19: qty 20, 5d supply, fill #0

## 2023-03-22 DIAGNOSIS — M6281 Muscle weakness (generalized): Secondary | ICD-10-CM | POA: Diagnosis not present

## 2023-03-22 DIAGNOSIS — M25661 Stiffness of right knee, not elsewhere classified: Secondary | ICD-10-CM | POA: Diagnosis not present

## 2023-03-22 DIAGNOSIS — S83231D Complex tear of medial meniscus, current injury, right knee, subsequent encounter: Secondary | ICD-10-CM | POA: Diagnosis not present

## 2023-05-29 ENCOUNTER — Telehealth: Admitting: Physician Assistant

## 2023-05-29 DIAGNOSIS — B001 Herpesviral vesicular dermatitis: Secondary | ICD-10-CM

## 2023-05-29 MED ORDER — VALACYCLOVIR HCL 1 G PO TABS
2000.0000 mg | ORAL_TABLET | Freq: Two times a day (BID) | ORAL | 0 refills | Status: AC
Start: 2023-05-29 — End: 2023-06-06
  Filled 2023-05-29: qty 4, 1d supply, fill #0

## 2023-05-29 NOTE — Progress Notes (Signed)

## 2023-05-30 ENCOUNTER — Other Ambulatory Visit (HOSPITAL_COMMUNITY): Payer: Self-pay

## 2024-01-09 ENCOUNTER — Encounter

## 2024-01-11 LAB — QUANTIFERON (R) TB GOLD: Quantiferon TB Gold Plus: NEGATIVE

## 2024-01-11 LAB — QUANTIFERON TB GOLD PLUS
QuantiFERON Mitogen: >10 [IU]/mL
QuantiFERON Nil: 0.04 [IU]/mL
QuantiFERON TB1 Ag Value: 0.04 [IU]/mL
QuantiFERON TB2 Ag Value: 0.04 [IU]/mL
